# Patient Record
Sex: Female | Born: 1975
Health system: Southern US, Community
[De-identification: ages and names within clinical notes are randomized; demographics above are authoritative.]

## PROBLEM LIST (undated history)

## (undated) ENCOUNTER — Inpatient Hospital Stay (HOSPITAL_COMMUNITY): Payer: Self-pay

## (undated) DIAGNOSIS — R51 Headache: Secondary | ICD-10-CM

## (undated) DIAGNOSIS — Q5128 Other doubling of uterus, other specified: Secondary | ICD-10-CM

## (undated) DIAGNOSIS — O139 Gestational [pregnancy-induced] hypertension without significant proteinuria, unspecified trimester: Secondary | ICD-10-CM

## (undated) DIAGNOSIS — K589 Irritable bowel syndrome without diarrhea: Secondary | ICD-10-CM

## (undated) DIAGNOSIS — O039 Complete or unspecified spontaneous abortion without complication: Secondary | ICD-10-CM

## (undated) DIAGNOSIS — Q512 Other doubling of uterus, unspecified: Secondary | ICD-10-CM

## (undated) HISTORY — DX: Irritable bowel syndrome, unspecified: K58.9

## (undated) HISTORY — PX: DILATION AND CURETTAGE OF UTERUS: SHX78

## (undated) HISTORY — PX: WISDOM TOOTH EXTRACTION: SHX21

## (undated) HISTORY — DX: Complete or unspecified spontaneous abortion without complication: O03.9

## (undated) HISTORY — DX: Headache: R51

---

## 2006-10-30 HISTORY — PX: HEMORRHOID SURGERY: SHX153

## 2010-03-29 ENCOUNTER — Ambulatory Visit (HOSPITAL_COMMUNITY): Admission: RE | Admit: 2010-03-29 | Discharge: 2010-03-29 | Payer: Self-pay | Admitting: Obstetrics and Gynecology

## 2010-04-29 DEATH — deceased

## 2011-01-16 LAB — CBC
HCT: 40.7 % (ref 36.0–46.0)
MCHC: 34.5 g/dL (ref 30.0–36.0)
MCV: 95.5 fL (ref 78.0–100.0)
WBC: 7 10*3/uL (ref 4.0–10.5)

## 2011-01-16 LAB — RH IMMUNE GLOB WKUP(>/=20WKS)(NOT WOMEN'S HOSP): Antibody Screen: NEGATIVE

## 2011-01-16 LAB — ABO/RH: ABO/RH(D): O NEG

## 2011-02-09 LAB — CBC
Hemoglobin: 14.8 g/dL (ref 12.0–16.0)
Platelets: 255 10*3/uL (ref 150–399)

## 2011-02-09 LAB — RUBELLA ANTIBODY, IGM: Rubella: IMMUNE

## 2011-02-09 LAB — ABO/RH: RH Type: NEGATIVE

## 2011-02-09 LAB — HEPATITIS B SURFACE ANTIGEN: Hepatitis B Surface Ag: NEGATIVE

## 2011-02-16 LAB — GC/CHLAMYDIA PROBE AMP, GENITAL
Chlamydia: NEGATIVE
Gonorrhea: NEGATIVE

## 2011-07-28 ENCOUNTER — Inpatient Hospital Stay (HOSPITAL_COMMUNITY)
Admission: AD | Admit: 2011-07-28 | Discharge: 2011-07-30 | DRG: 782 | Disposition: A | Discharge status: Home / Self Care | Payer: 59 | Source: Ambulatory Visit | Attending: Obstetrics and Gynecology | Admitting: Obstetrics and Gynecology

## 2011-07-28 ENCOUNTER — Encounter (HOSPITAL_COMMUNITY): Payer: Self-pay

## 2011-07-28 ENCOUNTER — Other Ambulatory Visit (HOSPITAL_COMMUNITY): Payer: Self-pay | Admitting: Obstetrics and Gynecology

## 2011-07-28 DIAGNOSIS — O2690 Pregnancy related conditions, unspecified, unspecified trimester: Secondary | ICD-10-CM

## 2011-07-28 DIAGNOSIS — D696 Thrombocytopenia, unspecified: Secondary | ICD-10-CM | POA: Diagnosis present

## 2011-07-28 DIAGNOSIS — O139 Gestational [pregnancy-induced] hypertension without significant proteinuria, unspecified trimester: Principal | ICD-10-CM | POA: Diagnosis present

## 2011-07-28 DIAGNOSIS — D689 Coagulation defect, unspecified: Secondary | ICD-10-CM | POA: Diagnosis present

## 2011-07-28 HISTORY — DX: Gestational (pregnancy-induced) hypertension without significant proteinuria, unspecified trimester: O13.9

## 2011-07-28 HISTORY — DX: Other and unspecified doubling of uterus: Q51.28

## 2011-07-28 HISTORY — DX: Other doubling of uterus, unspecified: Q51.20

## 2011-07-28 LAB — URINE MICROSCOPIC-ADD ON

## 2011-07-28 LAB — URINALYSIS, ROUTINE W REFLEX MICROSCOPIC
Nitrite: NEGATIVE
Protein, ur: NEGATIVE mg/dL

## 2011-07-28 LAB — COMPREHENSIVE METABOLIC PANEL
AST: 16 U/L (ref 0–37)
Albumin: 2.8 g/dL — ABNORMAL LOW (ref 3.5–5.2)
CO2: 27 mEq/L (ref 19–32)
GFR calc Af Amer: >60 mL/min (ref >60)
GFR calc non Af Amer: >60 mL/min (ref >60)
Potassium: 3.8 mEq/L (ref 3.5–5.1)
Total Protein: 6.9 g/dL (ref 6.0–8.3)

## 2011-07-28 LAB — LACTATE DEHYDROGENASE: LDH: 178 U/L (ref 94–250)

## 2011-07-28 LAB — CBC
RBC: 4.01 MIL/uL (ref 3.87–5.11)
RDW: 12.9 % (ref 11.5–15.5)
WBC: 12.9 10*3/uL — ABNORMAL HIGH (ref 4.0–10.5)

## 2011-07-28 MED ORDER — ZOLPIDEM TARTRATE 10 MG PO TABS
10.0000 mg | ORAL_TABLET | Freq: Every evening | ORAL | Status: DC | PRN
  Administered 2011-07-29: 10 mg via ORAL
  Filled 2011-07-28: qty 1

## 2011-07-28 MED ORDER — PRENATAL PLUS 27-1 MG PO TABS
1.0000 | ORAL_TABLET | Freq: Every day | ORAL | Status: DC
  Administered 2011-07-28 – 2011-07-29 (×2): 1 via ORAL
  Filled 2011-07-28 (×5): qty 1

## 2011-07-28 MED ORDER — DOCUSATE SODIUM 100 MG PO CAPS
100.0000 mg | ORAL_CAPSULE | Freq: Every day | ORAL | Status: DC
  Administered 2011-07-28 – 2011-07-29 (×2): 100 mg via ORAL
  Filled 2011-07-28 (×5): qty 1

## 2011-07-28 MED ORDER — CALCIUM CARBONATE ANTACID 500 MG PO CHEW
2.0000 | CHEWABLE_TABLET | ORAL | Status: DC | PRN
  Administered 2011-07-29: 400 mg via ORAL
  Filled 2011-07-28 (×2): qty 1
  Filled 2011-07-28: qty 2

## 2011-07-28 MED ORDER — ASPIRIN 81 MG PO CHEW
81.0000 mg | CHEWABLE_TABLET | Freq: Every day | ORAL | Status: DC
  Administered 2011-07-28 – 2011-07-29 (×2): 81 mg via ORAL
  Filled 2011-07-28 (×3): qty 1

## 2011-07-28 MED ORDER — ACETAMINOPHEN 325 MG PO TABS
650.0000 mg | ORAL_TABLET | ORAL | Status: DC | PRN
  Administered 2011-07-28 – 2011-07-30 (×6): 650 mg via ORAL
  Filled 2011-07-28 (×6): qty 2

## 2011-07-28 NOTE — ED Provider Notes (Signed)
History   Pt presents today for preeclamptic workup. She is currently 34.0wks and was noted to have elevated BP in the office. She also reports a HA but denies blurry vision or RUQ pain. She reports GFM. She denies vag dc, bleeding, or any other sx at this time.  Chief Complaint  Patient presents with  . Hypertension   HPI  OB History    Grav Para Term Preterm Abortions TAB SAB Ect Mult Living   7 2 2  0 4 0 4 0 0 2      Past Medical History  Diagnosis Date  . Pregnancy induced hypertension   . Asthma     Past Surgical History  Procedure Date  . Dilation and curettage of uterus     No family history on file.  History  Substance Use Topics  . Smoking status: Never Smoker   . Smokeless tobacco: Not on file  . Alcohol Use: Yes     socially before pregnancy    Allergies:  Allergies  Allergen Reactions  . Sulfa Antibiotics Rash    Prescriptions prior to admission  Medication Sig Dispense Refill  . aspirin 81 MG chewable tablet Chew 81 mg by mouth daily.        . prenatal vitamin w/FE, FA (PRENATAL 1 + 1) 27-1 MG TABS Take 1 tablet by mouth daily.          Review of Systems  Constitutional: Negative for fever.  Eyes: Negative for blurred vision and double vision.  Cardiovascular: Negative for chest pain and palpitations.  Gastrointestinal: Negative for nausea, vomiting, abdominal pain, diarrhea and constipation.  Genitourinary: Negative for dysuria, urgency, frequency and hematuria.  Neurological: Positive for headaches. Negative for dizziness.  Psychiatric/Behavioral: Negative for depression and suicidal ideas.   Physical Exam   Blood pressure 140/85, pulse 83, temperature 98.8 F (37.1 C), temperature source Oral, resp. rate 16, height 5\' 2"  (1.575 m), weight 191 lb 12.8 oz (87 kg), SpO2 99.00%.  Physical Exam  Constitutional: She is oriented to person, place, and time. She appears well-developed and well-nourished. No distress.  HENT:  Head: Normocephalic  and atraumatic.  Eyes: EOM are normal. Pupils are equal, round, and reactive to light.  GI: Soft. She exhibits no distension. There is no tenderness. There is no rebound and no guarding.  Neurological: She is alert and oriented to person, place, and time.  Skin: Skin is warm and dry. She is not diaphoretic.  Psychiatric: She has a normal mood and affect. Her behavior is normal. Judgment and thought content normal.    MAU Course  Procedures  Results for orders placed during the hospital encounter of 07/28/11 (from the past 24 hour(s))  CBC     Status: Abnormal   Collection Time   07/28/11  2:00 PM      Component Value Range   WBC 12.9 (*) 4.0 - 10.5 (K/uL)   RBC 4.01  3.87 - 5.11 (MIL/uL)   Hemoglobin 13.1  12.0 - 15.0 (g/dL)   HCT 95.6  21.3 - 08.6 (%)   MCV 94.8  78.0 - 100.0 (fL)   MCH 32.7  26.0 - 34.0 (pg)   MCHC 34.5  30.0 - 36.0 (g/dL)   RDW 57.8  46.9 - 62.9 (%)   Platelets 136 (*) 150 - 400 (K/uL)  COMPREHENSIVE METABOLIC PANEL     Status: Abnormal   Collection Time   07/28/11  2:00 PM      Component Value Range   Sodium  135  135 - 145 (mEq/L)   Potassium 3.8  3.5 - 5.1 (mEq/L)   Chloride 100  96 - 112 (mEq/L)   CO2 27  19 - 32 (mEq/L)   Glucose, Bld 77  70 - 99 (mg/dL)   BUN 4 (*) 6 - 23 (mg/dL)   Creatinine, Ser 7.82  0.50 - 1.10 (mg/dL)   Calcium 9.9  8.4 - 95.6 (mg/dL)   Total Protein 6.9  6.0 - 8.3 (g/dL)   Albumin 2.8 (*) 3.5 - 5.2 (g/dL)   AST 16  0 - 37 (U/L)   ALT 7  0 - 35 (U/L)   Alkaline Phosphatase 116  39 - 117 (U/L)   Total Bilirubin 0.4  0.3 - 1.2 (mg/dL)   GFR calc non Af Amer >60  >60 (mL/min)   GFR calc Af Amer >60  >60 (mL/min)  LACTATE DEHYDROGENASE     Status: Normal   Collection Time   07/28/11  2:00 PM      Component Value Range   LD 178  94 - 250 (U/L)  URINALYSIS, ROUTINE W REFLEX MICROSCOPIC     Status: Abnormal   Collection Time   07/28/11  2:02 PM      Component Value Range   Color, Urine YELLOW  YELLOW    Appearance CLEAR  CLEAR     Specific Gravity, Urine 1.010  1.005 - 1.030    pH 7.0  5.0 - 8.0    Glucose, UA NEGATIVE  NEGATIVE (mg/dL)   Hgb urine dipstick NEGATIVE  NEGATIVE    Bilirubin Urine NEGATIVE  NEGATIVE    Ketones, ur NEGATIVE  NEGATIVE (mg/dL)   Protein, ur NEGATIVE  NEGATIVE (mg/dL)   Urobilinogen, UA 0.2  0.0 - 1.0 (mg/dL)   Nitrite NEGATIVE  NEGATIVE    Leukocytes, UA MODERATE (*) NEGATIVE   URINE MICROSCOPIC-ADD ON     Status: Abnormal   Collection Time   07/28/11  2:02 PM      Component Value Range   Squamous Epithelial / LPF MANY (*) RARE    WBC, UA 11-20  <3 (WBC/hpf)   RBC / HPF 3-6  <3 (RBC/hpf)   Bacteria, UA MANY (*) RARE     Discussed pt with Dr. Arelia Sneddon. Dr. Arelia Sneddon on his way to evaluate pt. Assessment and Plan  Care of pt turned over to Dr. Arelia Sneddon at 2:59pm.  Clinton Gallant. Hazely Sealey III, DrHSc, MPAS, PA-C  07/28/2011, 2:21 PM   Henrietta Hoover, PA 07/28/11 1459

## 2011-07-28 NOTE — Progress Notes (Signed)
Monitors off per orders 

## 2011-07-28 NOTE — Progress Notes (Signed)
Pt sent from the office today for evaluation of elevated BP. Pt states she has been having a headache, no bleeding or leaking and reports good fetal movement.

## 2011-07-28 NOTE — H&P (Signed)
  Patient is a 35 year old gravida 7 para 2 abortus 4 female at 22 weeks. She is brought in for management of pregnancy-induced hypertension and associated thrombocytopenia. 2 seen in the office today complaining of increased blood pressure. Blood pressure 140/96. Complains of episode of headaches and seeing spots. No right upper quadrant pain. Blood person office 120/94. Gen. reactive nonstress test. Due to elevations of blood pressure at home she was sent to MAU for further evaluation. The pressures in MAU were 130/80 and 140/90. Her PIH laboratories reveal a low platelet count of 136,000.  Other laboratories were normal. Patient will be admitted for observation.  Allergies: Sulfa Exline Medications: Prenatal vitamins  For past medical history family history social history please see prenatal records.  Views systems is noncontributory.  Physical exam: She is afebrile. Blood pressure 130/90. Other vital signs are stable. Respiratory exam: Lungs are clear to auscultation. Cardiovascular system exam: Regular rhythm and rate without murmurs or gallops Abdominal exam: Gravid uterus consistent with dates. Nontender. Cervical exam: Not performed Deep tendon reflex: 2+ without clonus Extremities: 2+ edema Exline  Impression: Intrauterine pregnancy at 34 weeks with pregnancy-induced hypertension. Thrombocytopenia. Plan: Patient be admitted for bedrest. We will repeat her laboratories tomorrow. We'll monitor every shift. Ultrasound be performed tomorrow.

## 2011-07-29 ENCOUNTER — Inpatient Hospital Stay (HOSPITAL_COMMUNITY): Payer: 59

## 2011-07-29 LAB — CBC
MCV: 95.1 fL (ref 78.0–100.0)
Platelets: 137 10*3/uL — ABNORMAL LOW (ref 150–400)
RBC: 3.86 MIL/uL — ABNORMAL LOW (ref 3.87–5.11)
RDW: 13.2 % (ref 11.5–15.5)
WBC: 11.5 10*3/uL — ABNORMAL HIGH (ref 4.0–10.5)

## 2011-07-29 LAB — CREATININE CLEARANCE, URINE, 24 HOUR
Collection Interval-CRCL: 24 hours
Creatinine Clearance: 190 mL/min — ABNORMAL HIGH (ref 75–115)
Creatinine, 24H Ur: 1422 mg/d (ref 700–1800)

## 2011-07-29 LAB — COMPREHENSIVE METABOLIC PANEL
ALT: 9 U/L (ref 0–35)
AST: 31 U/L (ref 0–37)
Albumin: 2.4 g/dL — ABNORMAL LOW (ref 3.5–5.2)
Chloride: 102 mEq/L (ref 96–112)
Creatinine, Ser: 0.52 mg/dL (ref 0.50–1.10)
Potassium: 4.8 mEq/L (ref 3.5–5.1)
Sodium: 133 mEq/L — ABNORMAL LOW (ref 135–145)
Total Bilirubin: 0.4 mg/dL (ref 0.3–1.2)

## 2011-07-29 NOTE — Progress Notes (Signed)
  S:  STILL C/O OF HEADACHE. NO SCOTOMATA O:  127/84 VSS        GRAVID UTERUS NONTENDER         DTR'S 1+        LABS:  PLATLET  137000 LFT NORMAL  24 HR URINE PENDING A:  IUP AT 34+ PIH P:  COMPLETE 24 HR URINE CHECK SONO FOR FETAL WELL BEING

## 2011-07-29 NOTE — Progress Notes (Signed)
Monitors off, NST reactive and reassuring

## 2011-07-30 LAB — COMPREHENSIVE METABOLIC PANEL
ALT: 6 U/L (ref 0–35)
Albumin: 2.4 g/dL — ABNORMAL LOW (ref 3.5–5.2)
Alkaline Phosphatase: 103 U/L (ref 39–117)
Glucose, Bld: 81 mg/dL (ref 70–99)
Potassium: 3.7 mEq/L (ref 3.5–5.1)
Sodium: 139 mEq/L (ref 135–145)
Total Protein: 5.9 g/dL — ABNORMAL LOW (ref 6.0–8.3)

## 2011-07-30 LAB — CBC
Hemoglobin: 11.7 g/dL — ABNORMAL LOW (ref 12.0–15.0)
MCHC: 33.2 g/dL (ref 30.0–36.0)
RDW: 13.2 % (ref 11.5–15.5)
WBC: 10.5 10*3/uL (ref 4.0–10.5)

## 2011-07-30 NOTE — Progress Notes (Signed)
  S:  NO HEADACHES OR SCOTOMATA O: BP 120/70 VSS      DTR 1 +      PLATELET 123000 L  LFT WNL  24 HOUR URINE 167 MG OR PROTIEN IN 24 HRS  CRCL 190      SONO:  BPP 8/8 AFI 21 79%TILE BREECH EFW OK  HC > 97 % TILE A:  IUP AT 34 +  MILD THROMBOCYTOPENIA BUT NO OTHER SIGNS OF PIH P:  D/C HOME PIH PRECAUTIONS F/U IN OFFICE TOMORROW

## 2011-07-30 NOTE — Discharge Summary (Signed)
  Admitting diagnosis: Intrauterine pregnancy at 34 weeks with possible pregnancy-induced hypertension. Thrombocytopenia.  Discharge diagnosis: Intrauterine pregnancy at 34+ weeks with thrombocytopenia. No other evidence of PIH  Complete history and physical please see dictated note  Course in the hospital: The patient was brought in and placed on bedrest. Monitoring of the baby revealed a reactive tracing with no decelerations. Initial platelet count was 136,000. Liver function tests were normal. The following day her platelets were 139,000. Liver function tests remain normal. A 24-hour urine was obtained. Clearance was 190. Patient spilt 167 g of protein in a 24-hour period an ultrasound was obtained. The infant was in the breech presentation. Amniotic fluid index was 21.3 which is the 79th percentile. Biophysical profile was 8 out of 8. Only slight abnormality was the head circumference was greater than the 97th percentile. Intracranial structures were normal. Patient's blood pressure remained very normal per hospitalization. At the time of discharge her blood pressure was 120/70. We had ranges of 102/58 to 127/81. At the time of discharge she was stable. No headaches or scotomata or right upper quadrant pain. Her deep tendon reflexes were 1+.  In terms of complications none were encountered her stay in the hospital.  Patient discharged home in stable condition  Plan: Patient be seen in the office tomorrow for further evaluation. She is to be home on bedrest. Pregnancy-induced hypertension warning signs are discussed. We will repeat her platelet count tomorrow. She'll have a followup ultrasound. Further management will be arranged.

## 2011-07-31 LAB — PROTEIN, URINE, 24 HOUR
Collection Interval-UPROT: 24 hours
Protein, 24H Urine: 167 mg/d — ABNORMAL HIGH (ref 50–100)
Protein, Urine: 4 mg/dL
Urine Total Volume-UPROT: 4175 mL

## 2011-08-14 ENCOUNTER — Telehealth (HOSPITAL_COMMUNITY): Payer: Self-pay | Admitting: *Deleted

## 2011-08-14 ENCOUNTER — Encounter (HOSPITAL_COMMUNITY): Payer: Self-pay | Admitting: *Deleted

## 2011-08-14 NOTE — Telephone Encounter (Signed)
Preadmission screen  

## 2011-08-16 MED ORDER — TERBUTALINE SULFATE 1 MG/ML IJ SOLN
0.2500 mg | Freq: Once | INTRAMUSCULAR | Status: AC
  Administered 2011-08-17: 0.25 mg via SUBCUTANEOUS
  Filled 2011-08-16: qty 1

## 2011-08-16 NOTE — H&P (Signed)
DYNA FIGUEREO  DICTATION # [id number] CSN# [id number]   Meriel Pica, MD 08/16/2011 9:16 AM

## 2011-08-17 ENCOUNTER — Encounter (HOSPITAL_COMMUNITY): Payer: Self-pay

## 2011-08-17 ENCOUNTER — Observation Stay (HOSPITAL_COMMUNITY)
Admission: RE | Admit: 2011-08-17 | Discharge: 2011-08-17 | Disposition: A | Discharge status: Home / Self Care | Payer: 59 | Source: Ambulatory Visit | Attending: Obstetrics and Gynecology | Admitting: Obstetrics and Gynecology

## 2011-08-17 DIAGNOSIS — O321XX0 Maternal care for breech presentation, not applicable or unspecified: Principal | ICD-10-CM | POA: Insufficient documentation

## 2011-08-17 DIAGNOSIS — O139 Gestational [pregnancy-induced] hypertension without significant proteinuria, unspecified trimester: Secondary | ICD-10-CM | POA: Insufficient documentation

## 2011-08-17 MED ORDER — TERBUTALINE SULFATE 1 MG/ML IJ SOLN
INTRAMUSCULAR | Status: AC
  Administered 2011-08-17: 0.25 mg via SUBCUTANEOUS
  Filled 2011-08-17: qty 1

## 2011-08-17 MED ORDER — INFLUENZA VAC TYP A&B SURF ANT IM INJ: 0.5000 mL | INJECTION | Freq: Once | INTRAMUSCULAR | Status: DC

## 2011-08-17 MED ORDER — INFLUENZA VIRUS VACC SPLIT PF IM SUSP
0.5000 mL | Freq: Once | INTRAMUSCULAR | Status: DC
  Filled 2011-08-17: qty 0.5

## 2011-08-17 NOTE — H&P (Signed)
NAME:  [name], [name]                   ACCOUNT NO.:  MEDICAL RECORD NO.:  LOCATION:                                 FACILITY:  PHYSICIAN:  Rondel Episcopo M. Marcelle Overlie, M.D.    DATE OF BIRTH:  DATE OF ADMISSION: DATE OF DISCHARGE:                             HISTORY & PHYSICAL   CHIEF COMPLAINT:  Breech presentation, near term for ECV.  HISTORY OF PRESENT ILLNESS:  This is a 35 year old G-7, P 2-0-4-2, EDD September 08, 2011.  The patient has a persistent breech presentation and presents at this time for ECV.  This procedure including the likelihood of the procedure working the fact that the fetus may revert back to breech or the need for emergent cesarean section for labor, fetal distress, rupture of membranes, all reviewed with her which she understands and accepts.  Recently, her prenatal course has been marked by borderline hypertension.  She has been at bedrest.  She has a history of uterus didelphys and recurrent pregnancy loss.  She has had 2 prior vaginal deliveries in 1999 and 2003, and 3-4 early pregnancy losses.  She is Rh negative.  PAST MEDICAL HISTORY:  Please see the [facility] form for details.  PHYSICAL EXAMINATION:  VITAL SIGNS:  Temperature 98.2, blood pressure 130/84. HEENT:  Unremarkable. NECK:  Supple without masses. LUNGS:  Clear. CARDIOVASCULAR:  Regular rate and rhythm without murmurs, rubs, or gallops. BREASTS:  Not examined. ABDOMEN:  37 cm fundal height.  Breech by Leopold's.  Fetal heart rate 140. CERVIX:  Closed. EXTREMITIES:  1+ edema.  Reflexes 1+.  No clonus.  LABORATORY DATA:  Urine negative for protein.  IMPRESSION: 1. History of uterus didelphys. 2. Gestational hypertension. 3. Persistent breech presentation.  PLAN:  Outpatient ECV.  Discussed the procedure including preprocedure NST, terbutaline, and the technique for ultrasound ECV explained to her along with risks as noted above.     Laquanda Bick M. Marcelle Overlie, M.D.     [facility]MED[facility]  D:   08/16/2011  T:  08/16/2011  Job:  [id number]

## 2011-08-17 NOTE — Progress Notes (Signed)
1015 Pt discharged in stable condition per ambulatory with husband

## 2011-08-17 NOTE — Progress Notes (Signed)
R NST>>0.25mg  SQ terb  Bedside US frank breech with adeq AF.  Unable to accomplish ECV.  Will monitor w/ NST and sched primary CS for 39 weeks

## 2011-08-21 NOTE — H&P (Signed)
[name]  DICTATION # [id number] CSN# [id number]   Meriel Pica, MD 08/21/2011 12:49 PM

## 2011-08-22 NOTE — H&P (Signed)
NAME:  [name] [name]                   ACCOUNT NO.:  MEDICAL RECORD NO.:  LOCATION:                                 FACILITY:  PHYSICIAN:  [name] [name] M. Marcelle Overlie, M.D.    DATE OF BIRTH:  DATE OF ADMISSION: DATE OF DISCHARGE:                             HISTORY & PHYSICAL   CHIEF COMPLAINT:  Breech presentation at term, mild preeclampsia.  HISTORY OF PRESENT ILLNESS:  A 35 year old, G7, P-2-0-4-2, EDD September 08, 2011.  The patient is noted to have a uterus didelphys, has had a persistent breech presentation with this pregnancy.  Attempted ECV was failure to the verge, presents now for primary cesarean section.  This procedure including risks related to bleeding, infection, transfusion, adjacent organ injury, other risks related to phlebitis, wound infection, and expected recovery time all reviewed which she understands and accepts.  On the last several weeks of her pregnancy, she is have borderline hypertension, urine negative for protein.  PIH labs have consistently shown normal platelet count and normal liver function testing.  She has been on increased rest at home.  PAST MEDICAL HISTORY:  She has a history of recurrent pregnancy loss. Did deliver vaginally in 1999 and in 2003, but has also had three pregnancy losses.  Please see her [facility] form for the details of her past medical history.  PHYSICAL EXAMINATION:  VITAL SIGNS:  Temperature 98.2, blood pressure 120/90. HEENT:  Unremarkable. NECK:  Supple without masses. LUNGS:  Clear. CARDIOVASCULAR:  Regular rate and rhythm without murmurs, rubs, or gallops. BREASTS:  Without masses.  Term fundal height.  Breech by Thayer Ohm. Fetal heart rate 146.  Cervix was closed. EXTREMITIES:  1+ edema.  Reflex is 1+.  No clonus.  IMPRESSION: 1. Mild preeclampsia at term. 2. Persistent breech presentation.  PLAN:  Primary cesarean section.  Procedure and risks were reviewed as above.    [name] [name] M. Marcelle Overlie,  M.D.    [facility]/MEDQ  D:  08/21/2011  T:  08/22/2011  Job:  [id number]

## 2011-08-24 ENCOUNTER — Encounter (HOSPITAL_COMMUNITY)
Admission: RE | Admit: 2011-08-24 | Discharge: 2011-08-24 | Disposition: A | Discharge status: Home / Self Care | Payer: 59 | Source: Ambulatory Visit | Attending: Obstetrics and Gynecology | Admitting: Obstetrics and Gynecology

## 2011-08-24 ENCOUNTER — Encounter (HOSPITAL_COMMUNITY): Payer: Self-pay

## 2011-08-24 LAB — CBC
MCV: 93.7 fL (ref 78.0–100.0)
Platelets: 133 10*3/uL — ABNORMAL LOW (ref 150–400)
RDW: 13.2 % (ref 11.5–15.5)
WBC: 9.8 10*3/uL (ref 4.0–10.5)

## 2011-08-24 LAB — SURGICAL PCR SCREEN: Staphylococcus aureus: NEGATIVE

## 2011-08-24 LAB — RPR: RPR Ser Ql: NONREACTIVE

## 2011-08-24 NOTE — Patient Instructions (Signed)
   Your procedure is scheduled on:08/31/11  Enter through the Main Entrance of [facility] at:2:00 pm Pick up the phone at the desk and dial [phone] and inform us of your arrival.  Please call this number if you have any problems the morning of surgery: [phone]  Remember: Do not eat food after midnight:Wed Do not drink clear liquids after:1130 am Take these medicines the morning of surgery with a SIP OF WATER:  Do not wear jewelry, make-up, or FINGER nail polish Do not wear lotions, powders, or perfumes.  You may wear deodorant. Do not shave 48 hours prior to surgery. Do not bring valuables to the hospital.  Leave suitcase in the car. After Surgery it may be brought to your room. For patients being admitted to the hospital, checkout time is 11:00am the day of discharge.  Patients discharged on the day of surgery will not be allowed to drive home.   Name and phone number of your driver: Tammy Sours- [phone]  Remember to use your hibiclens as instructed.Please shower with 1/2 bottle the evening before your surgery and the other 1/2 bottle the morning of surgery.

## 2011-08-31 ENCOUNTER — Encounter (HOSPITAL_COMMUNITY)
Admission: RE | Disposition: A | Discharge status: Home / Self Care | Payer: Self-pay | Source: Ambulatory Visit | Attending: Obstetrics and Gynecology

## 2011-08-31 ENCOUNTER — Encounter (HOSPITAL_COMMUNITY): Payer: Self-pay | Admitting: Anesthesiology

## 2011-08-31 ENCOUNTER — Inpatient Hospital Stay (HOSPITAL_COMMUNITY)
Admission: RE | Admit: 2011-08-31 | Discharge: 2011-09-03 | DRG: 765 | Disposition: A | Discharge status: Home / Self Care | Payer: 59 | Source: Ambulatory Visit | Attending: Obstetrics and Gynecology | Admitting: Obstetrics and Gynecology

## 2011-08-31 ENCOUNTER — Encounter (HOSPITAL_COMMUNITY): Payer: Self-pay | Admitting: *Deleted

## 2011-08-31 ENCOUNTER — Inpatient Hospital Stay (HOSPITAL_COMMUNITY): Payer: 59 | Admitting: Anesthesiology

## 2011-08-31 DIAGNOSIS — K649 Unspecified hemorrhoids: Secondary | ICD-10-CM | POA: Diagnosis present

## 2011-08-31 DIAGNOSIS — O321XX0 Maternal care for breech presentation, not applicable or unspecified: Principal | ICD-10-CM | POA: Diagnosis present

## 2011-08-31 DIAGNOSIS — O878 Other venous complications in the puerperium: Secondary | ICD-10-CM | POA: Diagnosis present

## 2011-08-31 DIAGNOSIS — IMO0002 Reserved for concepts with insufficient information to code with codable children: Secondary | ICD-10-CM | POA: Diagnosis present

## 2011-08-31 SURGERY — Surgical Case
Anesthesia: Regional | Site: Abdomen | Wound class: Clean Contaminated

## 2011-08-31 MED ORDER — BUPIVACAINE IN DEXTROSE 0.75-8.25 % IT SOLN
INTRATHECAL | Status: DC | PRN
  Administered 2011-08-31: 1.6 mL via INTRATHECAL

## 2011-08-31 MED ORDER — DIBUCAINE 1 % RE OINT
1.0000 "application " | TOPICAL_OINTMENT | RECTAL | Status: DC | PRN
  Administered 2011-09-03: 1 via RECTAL
  Filled 2011-08-31: qty 28

## 2011-08-31 MED ORDER — FENTANYL CITRATE 0.05 MG/ML IJ SOLN
INTRAMUSCULAR | Status: AC
  Administered 2011-08-31: 50 ug via INTRAVENOUS
  Filled 2011-08-31: qty 2

## 2011-08-31 MED ORDER — HYDROMORPHONE HCL PF 1 MG/ML IJ SOLN
0.5000 mg | INTRAMUSCULAR | Status: AC | PRN
  Administered 2011-08-31 (×2): 0.5 mg via INTRAVENOUS
  Filled 2011-08-31: qty 1

## 2011-08-31 MED ORDER — MEPERIDINE HCL 25 MG/ML IJ SOLN: 6.2500 mg | INTRAMUSCULAR | Status: DC | PRN

## 2011-08-31 MED ORDER — IBUPROFEN 600 MG PO TABS
600.0000 mg | ORAL_TABLET | Freq: Four times a day (QID) | ORAL | Status: DC | PRN
  Administered 2011-09-02 – 2011-09-03 (×5): 600 mg via ORAL
  Filled 2011-08-31 (×5): qty 1

## 2011-08-31 MED ORDER — OXYTOCIN 10 UNIT/ML IJ SOLN
INTRAMUSCULAR | Status: AC
  Filled 2011-08-31: qty 2

## 2011-08-31 MED ORDER — OXYCODONE-ACETAMINOPHEN 5-325 MG PO TABS: 1.0000 | ORAL_TABLET | Freq: Four times a day (QID) | ORAL | Status: DC | PRN

## 2011-08-31 MED ORDER — NALBUPHINE HCL 10 MG/ML IJ SOLN
5.0000 mg | INTRAMUSCULAR | Status: DC | PRN
Start: 2011-08-31 — End: 2011-09-03
  Filled 2011-08-31: qty 1

## 2011-08-31 MED ORDER — BISACODYL 10 MG RE SUPP: 10.0000 mg | Freq: Every day | RECTAL | Status: DC | PRN

## 2011-08-31 MED ORDER — SIMETHICONE 80 MG PO CHEW
80.0000 mg | CHEWABLE_TABLET | ORAL | Status: DC | PRN
  Administered 2011-09-02: 80 mg via ORAL

## 2011-08-31 MED ORDER — ONDANSETRON HCL 4 MG/2ML IJ SOLN
INTRAMUSCULAR | Status: AC
  Filled 2011-08-31: qty 2

## 2011-08-31 MED ORDER — BUPIVACAINE IN DEXTROSE 0.75-8.25 % IT SOLN
INTRATHECAL | Status: AC
  Filled 2011-08-31: qty 2

## 2011-08-31 MED ORDER — SODIUM CHLORIDE 0.9 % IV SOLN: 1.0000 ug/kg/h | INTRAVENOUS | Status: DC | PRN

## 2011-08-31 MED ORDER — METOCLOPRAMIDE HCL 5 MG/ML IJ SOLN: 10.0000 mg | Freq: Three times a day (TID) | INTRAMUSCULAR | Status: DC | PRN

## 2011-08-31 MED ORDER — SCOPOLAMINE 1 MG/3DAYS TD PT72
1.0000 | MEDICATED_PATCH | Freq: Once | TRANSDERMAL | Status: DC
  Administered 2011-08-31: 1.5 mg via TRANSDERMAL

## 2011-08-31 MED ORDER — DIPHENHYDRAMINE HCL 25 MG PO CAPS: 25.0000 mg | ORAL_CAPSULE | ORAL | Status: DC | PRN

## 2011-08-31 MED ORDER — SENNOSIDES-DOCUSATE SODIUM 8.6-50 MG PO TABS
2.0000 | ORAL_TABLET | Freq: Every day | ORAL | Status: DC
  Administered 2011-09-01 – 2011-09-02 (×2): 2 via ORAL

## 2011-08-31 MED ORDER — LACTATED RINGERS IV SOLN
INTRAVENOUS | Status: DC
  Administered 2011-08-31: 21:00:00 via INTRAVENOUS

## 2011-08-31 MED ORDER — FENTANYL CITRATE 0.05 MG/ML IJ SOLN
25.0000 ug | INTRAMUSCULAR | Status: DC | PRN
  Administered 2011-08-31 (×4): 50 ug via INTRAVENOUS

## 2011-08-31 MED ORDER — IBUPROFEN 800 MG PO TABS: 800.0000 mg | ORAL_TABLET | Freq: Three times a day (TID) | ORAL | Status: DC | PRN

## 2011-08-31 MED ORDER — NALOXONE HCL 0.4 MG/ML IJ SOLN: 0.4000 mg | INTRAMUSCULAR | Status: DC | PRN

## 2011-08-31 MED ORDER — CEFAZOLIN SODIUM 1-5 GM-% IV SOLN
INTRAVENOUS | Status: AC
  Administered 2011-08-31: 1 g via INTRAVENOUS
  Filled 2011-08-31: qty 50

## 2011-08-31 MED ORDER — PHENYLEPHRINE HCL 10 MG/ML IJ SOLN
INTRAMUSCULAR | Status: AC
  Filled 2011-08-31: qty 1

## 2011-08-31 MED ORDER — SODIUM CHLORIDE 0.9 % IJ SOLN: 3.0000 mL | INTRAMUSCULAR | Status: DC | PRN

## 2011-08-31 MED ORDER — LANOLIN HYDROUS EX OINT: 1.0000 "application " | TOPICAL_OINTMENT | CUTANEOUS | Status: DC | PRN

## 2011-08-31 MED ORDER — MENTHOL 3 MG MT LOZG: 1.0000 | LOZENGE | OROMUCOSAL | Status: DC | PRN

## 2011-08-31 MED ORDER — NALBUPHINE HCL 10 MG/ML IJ SOLN
5.0000 mg | INTRAMUSCULAR | Status: DC | PRN
  Administered 2011-08-31: 10 mg via INTRAVENOUS
  Administered 2011-09-01: 5 mg via INTRAVENOUS
  Filled 2011-08-31: qty 1

## 2011-08-31 MED ORDER — MORPHINE SULFATE (PF) 0.5 MG/ML IJ SOLN
INTRAMUSCULAR | Status: DC | PRN
  Administered 2011-08-31: 150 ug via INTRATHECAL

## 2011-08-31 MED ORDER — FENTANYL CITRATE 0.05 MG/ML IJ SOLN
INTRAMUSCULAR | Status: AC
  Filled 2011-08-31: qty 2

## 2011-08-31 MED ORDER — LACTATED RINGERS IV SOLN
INTRAVENOUS | Status: DC
  Administered 2011-08-31 (×3): via INTRAVENOUS

## 2011-08-31 MED ORDER — TETANUS-DIPHTH-ACELL PERTUSSIS 5-2.5-18.5 LF-MCG/0.5 IM SUSP: 0.5000 mL | Freq: Once | INTRAMUSCULAR | Status: DC

## 2011-08-31 MED ORDER — OXYTOCIN 20 UNITS IN LACTATED RINGERS INFUSION - SIMPLE
INTRAVENOUS | Status: DC | PRN
  Administered 2011-08-31: 20 [IU] via INTRAVENOUS

## 2011-08-31 MED ORDER — FENTANYL CITRATE 0.05 MG/ML IJ SOLN
INTRAMUSCULAR | Status: DC | PRN
  Administered 2011-08-31: 75 ug via INTRAVENOUS
  Administered 2011-08-31: 25 ug via INTRATHECAL

## 2011-08-31 MED ORDER — DIPHENHYDRAMINE HCL 50 MG/ML IJ SOLN: 12.5000 mg | INTRAMUSCULAR | Status: DC | PRN

## 2011-08-31 MED ORDER — SIMETHICONE 80 MG PO CHEW
80.0000 mg | CHEWABLE_TABLET | Freq: Three times a day (TID) | ORAL | Status: DC
  Administered 2011-09-01 – 2011-09-03 (×9): 80 mg via ORAL

## 2011-08-31 MED ORDER — ZOLPIDEM TARTRATE 5 MG PO TABS: 5.0000 mg | ORAL_TABLET | Freq: Every evening | ORAL | Status: DC | PRN

## 2011-08-31 MED ORDER — ONDANSETRON HCL 4 MG/2ML IJ SOLN: 4.0000 mg | Freq: Three times a day (TID) | INTRAMUSCULAR | Status: DC | PRN

## 2011-08-31 MED ORDER — PRENATAL PLUS 27-1 MG PO TABS
1.0000 | ORAL_TABLET | Freq: Every day | ORAL | Status: DC
  Administered 2011-09-03: 1 via ORAL
  Filled 2011-08-31 (×2): qty 1

## 2011-08-31 MED ORDER — SCOPOLAMINE 1 MG/3DAYS TD PT72: 1.0000 | MEDICATED_PATCH | Freq: Once | TRANSDERMAL | Status: DC

## 2011-08-31 MED ORDER — MORPHINE SULFATE (PF) 0.5 MG/ML IJ SOLN
INTRAMUSCULAR | Status: DC | PRN
  Administered 2011-08-31 (×2): 2000 ug via EPIDURAL

## 2011-08-31 MED ORDER — SODIUM CHLORIDE 0.9 % IJ SOLN: 3.0000 mL | Freq: Two times a day (BID) | INTRAMUSCULAR | Status: DC

## 2011-08-31 MED ORDER — CEFAZOLIN SODIUM 1-5 GM-% IV SOLN: 1.0000 g | INTRAVENOUS | Status: DC

## 2011-08-31 MED ORDER — DIPHENHYDRAMINE HCL 25 MG PO CAPS: 25.0000 mg | ORAL_CAPSULE | Freq: Four times a day (QID) | ORAL | Status: DC | PRN

## 2011-08-31 MED ORDER — OXYTOCIN 20 UNITS IN LACTATED RINGERS INFUSION - SIMPLE: 125.0000 mL/h | INTRAVENOUS | Status: AC

## 2011-08-31 MED ORDER — SODIUM CHLORIDE 0.9 % IV SOLN: 250.0000 mL | INTRAVENOUS | Status: DC

## 2011-08-31 MED ORDER — LACTATED RINGERS IV SOLN
INTRAVENOUS | Status: DC
  Administered 2011-09-01 (×2): via INTRAVENOUS

## 2011-08-31 MED ORDER — ONDANSETRON HCL 4 MG PO TABS: 4.0000 mg | ORAL_TABLET | ORAL | Status: DC | PRN

## 2011-08-31 MED ORDER — KETOROLAC TROMETHAMINE 30 MG/ML IJ SOLN
30.0000 mg | Freq: Four times a day (QID) | INTRAMUSCULAR | Status: AC | PRN
  Administered 2011-08-31 – 2011-09-01 (×2): 30 mg via INTRAVENOUS
  Filled 2011-08-31 (×2): qty 1

## 2011-08-31 MED ORDER — WITCH HAZEL-GLYCERIN EX PADS: 1.0000 "application " | MEDICATED_PAD | CUTANEOUS | Status: DC | PRN

## 2011-08-31 MED ORDER — PHENYLEPHRINE HCL 10 MG/ML IJ SOLN
INTRAMUSCULAR | Status: DC | PRN
  Administered 2011-08-31 (×2): 80 ug via INTRAVENOUS
  Administered 2011-08-31: 40 ug via INTRAVENOUS

## 2011-08-31 MED ORDER — METOCLOPRAMIDE HCL 5 MG/ML IJ SOLN: 10.0000 mg | Freq: Once | INTRAMUSCULAR | Status: DC | PRN

## 2011-08-31 MED ORDER — FLEET ENEMA 7-19 GM/118ML RE ENEM: 1.0000 | ENEMA | RECTAL | Status: DC | PRN

## 2011-08-31 MED ORDER — MORPHINE SULFATE 0.5 MG/ML IJ SOLN
INTRAMUSCULAR | Status: AC
  Filled 2011-08-31: qty 10

## 2011-08-31 MED ORDER — KETOROLAC TROMETHAMINE 30 MG/ML IJ SOLN: 30.0000 mg | Freq: Four times a day (QID) | INTRAMUSCULAR | Status: AC | PRN

## 2011-08-31 MED ORDER — ONDANSETRON HCL 4 MG/2ML IJ SOLN: 4.0000 mg | INTRAMUSCULAR | Status: DC | PRN

## 2011-08-31 MED ORDER — DIPHENHYDRAMINE HCL 50 MG/ML IJ SOLN: 25.0000 mg | INTRAMUSCULAR | Status: DC | PRN

## 2011-08-31 MED ORDER — MEASLES, MUMPS & RUBELLA VAC ~~LOC~~ INJ
0.5000 mL | INJECTION | Freq: Once | SUBCUTANEOUS | Status: DC
  Filled 2011-08-31: qty 0.5

## 2011-08-31 MED ORDER — SCOPOLAMINE 1 MG/3DAYS TD PT72
MEDICATED_PATCH | TRANSDERMAL | Status: AC
  Administered 2011-08-31: 1.5 mg via TRANSDERMAL
  Filled 2011-08-31: qty 1

## 2011-08-31 SURGICAL SUPPLY — 23 items
CLOTH BEACON ORANGE TIMEOUT ST (SAFETY) ×2 IMPLANT
DRESSING TELFA 8X3 (GAUZE/BANDAGES/DRESSINGS) IMPLANT
DRSG COVADERM 4X10 (GAUZE/BANDAGES/DRESSINGS) ×2 IMPLANT
ELECT REM PT RETURN 9FT ADLT (ELECTROSURGICAL) ×2
ELECTRODE REM PT RTRN 9FT ADLT (ELECTROSURGICAL) ×1 IMPLANT
EXTRACTOR VACUUM M CUP 4 TUBE (SUCTIONS) IMPLANT
GAUZE SPONGE 4X4 12PLY STRL LF (GAUZE/BANDAGES/DRESSINGS) ×4 IMPLANT
GLOVE BIO SURGEON STRL SZ7 (GLOVE) ×4 IMPLANT
GOWN PREVENTION PLUS LG XLONG (DISPOSABLE) ×6 IMPLANT
KIT ABG SYR 3ML LUER SLIP (SYRINGE) IMPLANT
NEEDLE HYPO 25X5/8 SAFETYGLIDE (NEEDLE) ×2 IMPLANT
NS IRRIG 1000ML POUR BTL (IV SOLUTION) ×2 IMPLANT
PACK C SECTION WH (CUSTOM PROCEDURE TRAY) ×2 IMPLANT
PAD ABD 7.5X8 STRL (GAUZE/BANDAGES/DRESSINGS) IMPLANT
SLEEVE SCD COMPRESS KNEE MED (MISCELLANEOUS) IMPLANT
SUT CHROMIC 0 CTX 36 (SUTURE) ×6 IMPLANT
SUT MON AB 4-0 PS1 27 (SUTURE) ×2 IMPLANT
SUT PDS AB 0 CT1 27 (SUTURE) ×4 IMPLANT
SUT VIC AB 3-0 CT1 27 (SUTURE) ×2
SUT VIC AB 3-0 CT1 TAPERPNT 27 (SUTURE) ×2 IMPLANT
TOWEL OR 17X24 6PK STRL BLUE (TOWEL DISPOSABLE) ×4 IMPLANT
TRAY FOLEY CATH 14FR (SET/KITS/TRAYS/PACK) ×2 IMPLANT
WATER STERILE IRR 1000ML POUR (IV SOLUTION) ×2 IMPLANT

## 2011-08-31 NOTE — Anesthesia Postprocedure Evaluation (Signed)
  Anesthesia Post-op Note  Patient: [name]  Procedure(s) Performed:  CESAREAN SECTION - primary  Patient Location: PACU  Anesthesia Type: Spinal  Level of Consciousness: awake, alert  and oriented  Airway and Oxygen Therapy: Patient Spontanous Breathing  Post-op Pain: none  Post-op Assessment: Post-op Vital signs reviewed, Patient's Cardiovascular Status Stable, Respiratory Function Stable, Patent Airway, No signs of Nausea or vomiting, Pain level controlled, No headache and No backache  Post-op Vital Signs: Reviewed and stable  Complications: No apparent anesthesia complications

## 2011-08-31 NOTE — Transfer of Care (Signed)
Immediate Anesthesia Transfer of Care Note  Patient: [name]  Procedure(s) Performed:  CESAREAN SECTION - primary  Patient Location: PACU  Anesthesia Type: Spinal  Level of Consciousness: awake and alert   Airway & Oxygen Therapy: Patient Spontanous Breathing  Post-op Assessment: Report given to PACU RN and Post -op Vital signs reviewed and stable  Post vital signs: Reviewed and stable  Complications: No apparent anesthesia complications

## 2011-08-31 NOTE — Anesthesia Procedure Notes (Signed)
Spinal Block  Patient location during procedure: OR Start time: 08/31/2011 4:28 PM Staffing Anesthesiologist: Elzia Hott A. Performed by: anesthesiologist  Preanesthetic Checklist Completed: patient identified, site marked, surgical consent, pre-op evaluation, timeout performed, IV checked, risks and benefits discussed and monitors and equipment checked Spinal Block Patient position: sitting Prep: site prepped and draped and DuraPrep Patient monitoring: heart rate, cardiac monitor, continuous pulse ox and blood pressure Approach: midline Location: L3-4 Injection technique: single-shot Needle Needle type: Sprotte  Needle gauge: 24 G Needle length: 9 cm Assessment Sensory level: T2 Additional Notes Patient tolerate procedure well. Adequate sensory level.

## 2011-08-31 NOTE — Addendum Note (Signed)
Addendum  created 08/31/11 2236 by Neeya Prigmore L. Soren Lazarz   Modules edited:Orders

## 2011-08-31 NOTE — Progress Notes (Signed)
Examined in pre-op, bedside US is breech/oblique, FHR 156. Pt c/o hemm, exam shows non-thrombosed hemorrhoids.  No change otherwise in H+PE

## 2011-08-31 NOTE — Op Note (Signed)
Preoperative diagnosis: Term IUP, persistent breech presentation, mild preeclampsia  Postoperative diagnosis: Same  Procedure: Primary low transverse cesarean section  Surgeon: Marcelle Overlie  Anesthesia: Spinal  EBL: 700 cc  Complications: None  Procedure and findings:  The patient was taken to the operating room after an adequate level of spinal anesthetic was obtained with the patient in left tilt position the abdomen was prepped and draped in the usual manner for cesarean section Foley catheter was positioned draining clear urine. Transverse incision made 2 finger breaths above the symphysis carried down to the fascia which was incised and extended transversely. Rectus muscle divided in the midline peritoneum entered superiorly without incident and extended in a vertical manner. The vesicouterine serosa was incised and the bladder was bluntly and sharply dissected below. Bladder blade was positioned. Transverse incision made in the lower segment extended with bandage scissors clear fluid was noted. A double footling breech presentation was noted easy full breech extraction without difficulty delivering a female Apgars 9 and 9 cord pH was sent and is pending at dictation. The placenta was then delivered manually intact, uterus exteriorized cavity wiped clean with a laparotomy pack closure obtained with the first layer with chromic in a locked fashion followed by an imbricating layer with chromic. This was hemostatic the bladder flap area was intact and hemostatic. Bilateral tubes and ovaries were normal. 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 Ovaries otherwise unremarkable.  Prior to closure sponge denies precast reported as correct x2. Enclose a running 2-0 Vicryl suture. 2-0 Vicryl interrupted sutures used to reapproximate the rectus muscles in the midline a 0 PDS was then used to close the  fascia from laterally to midline on either side subcutaneous tissue was irrigated noted to be hemostatic 4-0 Monocryl subcuticular suture a sterile dressing applied she did receive Ancef 1 g IV preop and Pitocin IV after the cord clamp mother and baby doing well at that point clear urine noted at the end of the case  Dictated with [employer], not proofread Rosaland Shiffman M. Milana Obey.D.

## 2011-08-31 NOTE — Anesthesia Preprocedure Evaluation (Addendum)
Anesthesia Evaluation  Patient identified by MRN, date of birth, ID band Patient awake  General Assessment Comment  Reviewed: Allergy & Precautions, H&P , NPO status , Patient's Chart, lab work & pertinent test results  History of Anesthesia Complications (+) PONV  Airway Mallampati: II TM Distance: >3 FB Neck ROM: Full    Dental No notable dental hx. (+) Teeth Intact   Pulmonary asthma  clear to auscultation  Pulmonary exam normal       Cardiovascular hypertension, Regular Normal    Neuro/Psych  Headaches, Negative Neurological ROS  Negative Psych ROS   GI/Hepatic Neg liver ROS  Irritable Bowel Syndrome    Endo/Other  Negative Endocrine ROS  Renal/GU negative Renal ROS  Genitourinary negative   Musculoskeletal   Abdominal   Peds  Hematology negative hematology ROS (+)   Anesthesia Other Findings   Reproductive/Obstetrics (+) Pregnancy                          Anesthesia Physical Anesthesia Plan  ASA: II  Anesthesia Plan: Spinal   Post-op Pain Management:    Induction:   Airway Management Planned:   Additional Equipment:   Intra-op Plan:   Post-operative Plan:   Informed Consent: I have reviewed the patients History and Physical, chart, labs and discussed the procedure including the risks, benefits and alternatives for the proposed anesthesia with the patient or authorized representative who has indicated his/her understanding and acceptance.     Plan Discussed with: Anesthesiologist  Anesthesia Plan Comments:         Anesthesia Quick Evaluation

## 2011-08-31 NOTE — Consult Note (Signed)
Neonatology Note:   Attendance at C-section:    I was asked to attend this primary C/S at term due to breech presentation. The mother is a G67P2A4  with uterus didelphys and mild PIH. ROM at delivery, fluid clear. Infant vigorous with good spontaneous cry and tone. Needed only minimal bulb suctioning. Ap 9/9. Lungs clear to ausc in DR. To CN to care of Pediatrician.   Deatra James, MD

## 2011-09-01 LAB — CBC
HCT: 33.2 % — ABNORMAL LOW (ref 36.0–46.0)
MCHC: 33.4 g/dL (ref 30.0–36.0)
RDW: 13.1 % (ref 11.5–15.5)

## 2011-09-01 MED ORDER — OXYCODONE-ACETAMINOPHEN 5-325 MG PO TABS
2.0000 | ORAL_TABLET | ORAL | Status: AC
  Administered 2011-09-01 (×2): 2 via ORAL
  Filled 2011-09-01 (×3): qty 2

## 2011-09-01 MED ORDER — RHO D IMMUNE GLOBULIN 1500 UNIT/2ML IJ SOLN
300.0000 ug | Freq: Once | INTRAMUSCULAR | Status: AC
  Administered 2011-09-01: 300 ug via INTRAMUSCULAR
  Filled 2011-09-01: qty 2

## 2011-09-01 MED ORDER — HYDROMORPHONE HCL PF 1 MG/ML IJ SOLN
0.5000 mg | INTRAMUSCULAR | Status: AC
  Administered 2011-09-01 (×2): 0.5 mg via INTRAVENOUS
  Filled 2011-09-01: qty 1

## 2011-09-01 MED ORDER — OXYCODONE-ACETAMINOPHEN 5-325 MG PO TABS
1.0000 | ORAL_TABLET | ORAL | Status: DC | PRN
  Administered 2011-09-01: 1 via ORAL
  Administered 2011-09-01 – 2011-09-03 (×9): 2 via ORAL
  Filled 2011-09-01 (×9): qty 2

## 2011-09-01 NOTE — Anesthesia Postprocedure Evaluation (Signed)
  Anesthesia Post-op Note  Patient: [name]  Procedure(s) Performed:  CESAREAN SECTION - primary  Patient Location: Mother/Baby  Anesthesia Type: Spinal  Level of Consciousness: awake and alert   Airway and Oxygen Therapy: Patient Spontanous Breathing  Post-op Assessment: Patient's Cardiovascular Status Stable and Respiratory Function Stable  Post-op Vital Signs: Reviewed and stable  Complications: No apparent anesthesia complications

## 2011-09-01 NOTE — Addendum Note (Signed)
Addendum  created 09/01/11 1005 by Edison Pace, CRNA   Modules edited:Notes Section

## 2011-09-01 NOTE — Progress Notes (Signed)
Had incisional pain last pm. Better today but still bothering her some. Passing flatus, No N/V.  BP 112/73  Pulse 62  Temp(Src) 98.3 F (36.8 C) (Oral)  Resp 18  Wt 90.266 kg (199 lb)  SpO2 95%  LMP 11/23/2010  Breastfeeding? Unknown  Lungs CTA Cor RRR Abd soft, mildly distended, +BS, Dressing dry Ext PAS on    Results for orders placed during the hospital encounter of 08/31/11 (from the past 24 hour(s))  CBC     Status: Abnormal   Collection Time   09/01/11  5:10 AM      Component Value Range   WBC 10.5  4.0 - 10.5 (K/uL)   RBC 3.56 (*) 3.87 - 5.11 (MIL/uL)   Hemoglobin 11.1 (*) 12.0 - 15.0 (g/dL)   HCT 14.7 (*) 82.9 - 46.0 (%)   MCV 93.3  78.0 - 100.0 (fL)   MCH 31.2  26.0 - 34.0 (pg)   MCHC 33.4  30.0 - 36.0 (g/dL)   RDW 56.2  13.0 - 86.5 (%)   Platelets 108 (*) 150 - 400 (K/uL)  A/P: Will give percocet on schedule x2, then prn        Hold NSAID for now because of low platelets        CBC in am

## 2011-09-01 NOTE — Addendum Note (Signed)
Addendum  created 09/01/11 0115 by Gualberto Wahlen L. Evalisse Prajapati   Modules edited:Orders

## 2011-09-02 LAB — CBC
MCV: 94.6 fL (ref 78.0–100.0)
Platelets: 129 10*3/uL — ABNORMAL LOW (ref 150–400)
RBC: 3.73 MIL/uL — ABNORMAL LOW (ref 3.87–5.11)
RDW: 13.5 % (ref 11.5–15.5)
WBC: 9.9 10*3/uL (ref 4.0–10.5)

## 2011-09-02 LAB — RH IG WORKUP (INCLUDES ABO/RH)
Antibody Screen: NEGATIVE
Unit division: 0

## 2011-09-02 NOTE — Progress Notes (Signed)
Ambulating, incision sore, + flatus Blood pressure 128/85, pulse 59, temperature 98.4 F (36.9 C), temperature source Oral, resp. rate 20, weight 90.266 kg (199 lb), last menstrual period 11/23/2010, SpO2 96.00%, unknown if currently breastfeeding.  Abd soft, incision OK  A: satisfactory P: probable D/C tomorrow

## 2011-09-03 MED ORDER — IBUPROFEN 600 MG PO TABS: 600.0000 mg | ORAL_TABLET | Freq: Four times a day (QID) | ORAL | Status: AC | PRN

## 2011-09-03 MED ORDER — OXYCODONE-ACETAMINOPHEN 5-325 MG PO TABS: 1.0000 | ORAL_TABLET | ORAL | Status: AC | PRN

## 2011-09-03 NOTE — Progress Notes (Signed)
Ready to go home Voiding, no BM yet  Blood pressure 117/69, pulse 68, temperature 98 F (36.7 C), temperature source Oral, resp. rate 20, weight 90.266 kg (199 lb), last menstrual period 11/23/2010, SpO2 96.00%, unknown if currently breastfeeding.  Lung CTA Abd soft, good BS  Incision healing well  D/C home Instructions given

## 2011-09-03 NOTE — Discharge Summary (Signed)
Obstetric Discharge Summary Reason for Admission: cesarean section Prenatal Procedures: ultrasound Intrapartum Procedures: cesarean: low cervical, transverse Postpartum Procedures: none Complications-Operative and Postpartum: none Hemoglobin  Date Value Range Status  09/02/2011 11.7* 12.0-15.0 (g/dL) Final     HCT  Date Value Range Status  09/02/2011 35.3* 36.0-46.0 (%) Final    Discharge Diagnoses: Term Pregnancy-delivered  Discharge Information: Date: 09/03/2011 Activity: pelvic rest Diet: routine Medications: PNV and Percocet Condition: stable Instructions: refer to practice specific booklet Discharge to: home Follow-up Information    Follow up with [facility]. Make an appointment in 6 weeks.   Contact information:   [address] [place] [address] [phone]          Newborn Data: Live born female  Birth Weight: 7 lb 11.6 oz (3505 g) APGAR: 9, 9  Home with mother.  Nayeli Calvert II,Kerrilyn Azbill E 09/03/2011, 8:42 AM

## 2011-09-04 ENCOUNTER — Encounter (HOSPITAL_COMMUNITY): Payer: Self-pay | Admitting: Obstetrics and Gynecology

## 2011-10-30 ENCOUNTER — Ambulatory Visit (INDEPENDENT_AMBULATORY_CARE_PROVIDER_SITE_OTHER): Payer: 59 | Admitting: General Surgery

## 2013-04-29 DIAGNOSIS — O039 Complete or unspecified spontaneous abortion without complication: Secondary | ICD-10-CM

## 2013-04-29 HISTORY — DX: Complete or unspecified spontaneous abortion without complication: O03.9

## 2013-09-02 ENCOUNTER — Encounter: Payer: Self-pay | Admitting: Diagnostic Neuroimaging

## 2013-09-02 ENCOUNTER — Ambulatory Visit (INDEPENDENT_AMBULATORY_CARE_PROVIDER_SITE_OTHER): Payer: 59 | Admitting: Diagnostic Neuroimaging

## 2013-09-02 VITALS — BP 147/91 | HR 71 | Temp 98.1°F | Ht 63.0 in | Wt 172.0 lb

## 2013-09-02 DIAGNOSIS — G4700 Insomnia, unspecified: Secondary | ICD-10-CM

## 2013-09-02 DIAGNOSIS — F411 Generalized anxiety disorder: Secondary | ICD-10-CM

## 2013-09-02 DIAGNOSIS — F329 Major depressive disorder, single episode, unspecified: Secondary | ICD-10-CM

## 2013-09-02 DIAGNOSIS — G43109 Migraine with aura, not intractable, without status migrainosus: Secondary | ICD-10-CM

## 2013-09-02 MED ORDER — RIZATRIPTAN BENZOATE 10 MG PO TBDP: 10.0000 mg | ORAL_TABLET | ORAL | Status: DC | PRN

## 2013-09-02 MED ORDER — AMITRIPTYLINE HCL 25 MG PO TABS: 25.0000 mg | ORAL_TABLET | Freq: Every day | ORAL | Status: DC

## 2013-09-02 NOTE — Progress Notes (Signed)
[facility]  PATIENT: [name] DOB: 03-05-1976  REFERRING CLINICIAN: Marcelle Overlie HISTORY FROM: patient REASON FOR VISIT: new consult   HISTORICAL  CHIEF COMPLAINT:  Chief Complaint  Patient presents with  . Headache    HISTORY OF PRESENT ILLNESS:   37 year old right-handed female here for evaluation of headaches.  Patient has had headaches since high school, consisting of severe pain, photophobia, nausea, seeing spots. In 2001 she had an episode where she was unable to see from her right eye, saw a neurologist in [place] was diagnosed with migraine headaches. She's tried on Imitrex but she could not tolerate. She did not followup at that time. Since then she is treated her headaches with over-the-counter medications like Tylenol, Excedrin Migraine, ibuprofen with minimal relief. Some years were better than others. Stress seems to aggravate her headaches.  July 2014 patient had miscarriage and ever since that time has had increasing severity of headaches. These headaches are more severe than previous although similar in quality. She has occipital, bitemporal, retro-orbital pain. She has photophobia, phonophobia nausea. Sometimes she has blurred vision. Sometimes she sees spots. She is having daily nagging headaches with superimposed breakthrough migraines almost a daily basis. Headaches can last all day. She has tried narcotic medications with mild relief.  Other contributing factors include severe insomnia, more stress, depression and anxiety.  REVIEW OF SYSTEMS: Full 14 system review of systems performed and notable only for fatigue pain easy bruising feeling cold diarrhea constipation depression anxiety not asleep decreased energy insomnia memory loss and headache.  ALLERGIES: Allergies  Allergen Reactions  . Sulfa Antibiotics Rash    HOME MEDICATIONS: Outpatient Prescriptions Prior to Visit  Medication Sig Dispense Refill  . prenatal vitamin  w/FE, FA (PRENATAL 1 + 1) 27-1 MG TABS Take 1 tablet by mouth daily.         No facility-administered medications prior to visit.    PAST MEDICAL HISTORY: Past Medical History  Diagnosis Date  . Uterus didelphus   . Pregnancy induced hypertension     with both previous pregnancies  . Headache(784.0)     migraines  . IBS (irritable bowel syndrome)   . Asthma     rare inhaler use  . Miscarriage 04/2013    PAST SURGICAL HISTORY: Past Surgical History  Procedure Laterality Date  . Dilation and curettage of uterus    . Hemorrhoid surgery  2008  . Cesarean section  08/31/2011    Procedure: CESAREAN SECTION;  Surgeon: Meriel Pica;  Location: [facility];  Service: Gynecology;  Laterality: N/A;  primary    FAMILY HISTORY: Family History  Problem Relation Age of Onset  . Hypertension Mother   . Thyroid disease Mother   . Cancer Mother     skin  . Hashimoto's thyroiditis Mother   . Cancer Maternal Grandmother     colon  . Cancer Maternal Grandfather     leaukemia  . Hypotension Neg Hx   . Malignant hyperthermia Neg Hx   . Pseudochol deficiency Neg Hx   . Anesthesia problems Cousin     difficulty waking up from anesthesia as child  . Migraines Mother   . Hypertension Mother   . Thyroid disease Maternal Grandmother   . Thyroid disease Cousin   . Thyroid disease Maternal Aunt   . Bone cancer Paternal Grandmother     SOCIAL HISTORY:  History   Social History  . Marital Status: Married    Spouse Name: Constance Holster"    Number  of Children: 3  . Years of Education: College   Occupational History  .  Other    Stay at home mom   Social History Main Topics  . Smoking status: Never Smoker   . Smokeless tobacco: Never Used  . Alcohol Use: Yes     Comment: 1 glass of wine nightly; beer occasionally  . Drug Use: No  . Sexual Activity: Yes    Birth Control/ Protection: Pill   Other Topics Concern  . Not on file   Social History Narrative   Patient lives at home with  family.   Caffeine Use 1 cup daily     PHYSICAL EXAM  Filed Vitals:   09/02/13 0902  BP: 147/91  Pulse: 71  Temp: 98.1 F (36.7 C)  TempSrc: Oral  Height: 5\' 3"  (1.6 m)  Weight: 172 lb (78.019 kg)    Not recorded    Body mass index is 30.48 kg/(m^2).  GENERAL EXAM: Patient is in no distress  CARDIOVASCULAR: Regular rate and rhythm, no murmurs, no carotid bruits  NEUROLOGIC: MENTAL STATUS: awake, alert, language fluent, comprehension intact, naming intact CRANIAL NERVE: no papilledema on fundoscopic exam, pupils equal and reactive to light, visual fields full to confrontation, extraocular muscles intact, no nystagmus, facial sensation and strength symmetric, uvula midline, shoulder shrug symmetric, tongue midline. MOTOR: normal bulk and tone, full strength in the BUE, BLE SENSORY: normal and symmetric to light touch, temperature, vibration COORDINATION: finger-nose-finger, fine finger movements normal REFLEXES: deep tendon reflexes present and symmetric GAIT/STATION: narrow based gait; able to walk on toes, heels and tandem; romberg is negative   DIAGNOSTIC DATA (LABS, IMAGING, TESTING) - I reviewed patient records, labs, notes, testing and imaging myself where available.  Lab Results  Component Value Date   WBC 9.9 09/02/2011   HGB 11.7* 09/02/2011   HCT 35.3* 09/02/2011   MCV 94.6 09/02/2011   PLT 129* 09/02/2011      Component Value Date/Time   NA 139 07/30/2011 0538   K 3.7 07/30/2011 0538   CL 105 07/30/2011 0538   CO2 26 07/30/2011 0538   GLUCOSE 81 07/30/2011 0538   BUN 9 07/30/2011 0538   CREATININE 0.63 07/30/2011 0538   CREATININE 0.52 07/29/2011 1600   CALCIUM 9.3 07/30/2011 0538   PROT 5.9* 07/30/2011 0538   ALBUMIN 2.4* 07/30/2011 0538   AST 14 07/30/2011 0538   ALT 6 07/30/2011 0538   ALKPHOS 103 07/30/2011 0538   BILITOT 0.3 07/30/2011 0538   GFRNONAA >60 07/30/2011 0538   GFRAA >60 07/30/2011 0538   No results found for this basename: CHOL, HDL, LDLCALC,  LDLDIRECT, TRIG, CHOLHDL   No results found for this basename: HGBA1C   No results found for this basename: VITAMINB12   No results found for this basename: TSH      ASSESSMENT AND PLAN  37 y.o. year old female here with long history of migraine headaches now with increased severity and frequency of headaches since July 2014. Most likely represents migraine exacerbation in the setting of insomnia and increased stress. I will check MRI of the brain to rule out secondary causes. Patient requested I check thyroid function for fatigue evaluation.  PLAN: - amitriptyline 25mg  qhs + maxalt 10mg  prn - stress and sleep factors reviewed  Orders Placed This Encounter  Procedures  . MR Brain Wo Contrast  . TSH   Return in about 3 months (around 12/03/2013) for with Heide Guile or Warren Kugelman.    Suanne Marker, MD  09/02/2013, 10:03 AM Certified in Neurology, Neurophysiology and Neuroimaging  [facility] [address], [address] [place], [place] [address] [phone]

## 2013-09-02 NOTE — Patient Instructions (Signed)
Migraine Headache A migraine headache is an intense, throbbing pain on one or both sides of your head. A migraine can last for 30 minutes to several hours. CAUSES  The exact cause of a migraine headache is not always known. However, a migraine may be caused when nerves in the brain become irritated and release chemicals that cause inflammation. This causes pain. SYMPTOMS  Pain on one or both sides of your head.  Pulsating or throbbing pain.  Severe pain that prevents daily activities.  Pain that is aggravated by any physical activity.  Nausea, vomiting, or both.  Dizziness.  Pain with exposure to bright lights, loud noises, or activity.  General sensitivity to bright lights, loud noises, or smells. Before you get a migraine, you may get warning signs that a migraine is coming (aura). An aura may include:  Seeing flashing lights.  Seeing bright spots, halos, or zig-zag lines.  Having tunnel vision or blurred vision.  Having feelings of numbness or tingling.  Having trouble talking.  Having muscle weakness. MIGRAINE TRIGGERS  Alcohol.  Smoking.  Stress.  Menstruation.  Aged cheeses.  Foods or drinks that contain nitrates, glutamate, aspartame, or tyramine.  Lack of sleep.  Chocolate.  Caffeine.  Hunger.  Physical exertion.  Fatigue.  Medicines used to treat chest pain (nitroglycerine), birth control pills, estrogen, and some blood pressure medicines. DIAGNOSIS  A migraine headache is often diagnosed based on:  Symptoms.  Physical examination.  A CT scan or MRI of your head. TREATMENT Medicines may be given for pain and nausea. Medicines can also be given to help prevent recurrent migraines.  HOME CARE INSTRUCTIONS  Only take over-the-counter or prescription medicines for pain or discomfort as directed by your caregiver. The use of long-term narcotics is not recommended.  Lie down in a dark, quiet room when you have a migraine.  Keep a journal  to find out what may trigger your migraine headaches. For example, write down:  What you eat and drink.  How much sleep you get.  Any change to your diet or medicines.  Limit alcohol consumption.  Quit smoking if you smoke.  Get 7 to 9 hours of sleep, or as recommended by your caregiver.  Limit stress.  Keep lights dim if bright lights bother you and make your migraines worse. SEEK IMMEDIATE MEDICAL CARE IF:   Your migraine becomes severe.  You have a fever.  You have a stiff neck.  You have vision loss.  You have muscular weakness or loss of muscle control.  You start losing your balance or have trouble walking.  You feel faint or pass out.  You have severe symptoms that are different from your first symptoms. MAKE SURE YOU:   Understand these instructions.  Will watch your condition.  Will get help right away if you are not doing well or get worse. Document Released: 10/16/2005 Document Revised: 01/08/2012 Document Reviewed: 10/06/2011 Exit[employer] Patient Information 2014 [employer].  

## 2013-09-03 ENCOUNTER — Telehealth: Payer: Self-pay | Admitting: Diagnostic Neuroimaging

## 2013-09-03 LAB — TSH: TSH: 0.988 u[IU]/mL (ref 0.450–4.500)

## 2013-09-12 MED ORDER — RIZATRIPTAN BENZOATE 10 MG PO TABS: 10.0000 mg | ORAL_TABLET | ORAL | Status: DC | PRN

## 2013-09-12 NOTE — Telephone Encounter (Signed)
TC to pt.  She asking about her lab results.  (TSH - normal).  She has no pcp.  Asking if normal previously and is now lower normal ) could this be indication that this is a problem.  Would like to get tier 2 (tablet of Maxalt vs the dissolving tablet that was called in and needs override per insurance to get the total 9 tabs).  Elavil that she is taking is making her irritable, groggy, (10 days now).  Does this go away?

## 2013-09-12 NOTE — Telephone Encounter (Signed)
I called pt and relayed that MRI order sent to [facility] [phone] on 09-10-13 from [place], [place] order for maxalt tabs placed and sent to pharmacy, if needs PA they can send form to [place], Elavil can be stopped, usually try for one month and then if not benefit then to stop.  No other option given at this time.  She will have more lab work from her ob-gyn for her thyroid.  She will call back as needed.

## 2013-09-12 NOTE — Telephone Encounter (Signed)
rizatriptan tabs rx'd. Ok to stop amitriptyline. -VRP

## 2013-12-03 ENCOUNTER — Ambulatory Visit: Payer: 59 | Admitting: Nurse Practitioner

## 2014-07-30 ENCOUNTER — Encounter (HOSPITAL_COMMUNITY): Payer: Self-pay | Admitting: Emergency Medicine

## 2014-07-30 ENCOUNTER — Emergency Department (HOSPITAL_COMMUNITY): Payer: BC Managed Care – PPO

## 2014-07-30 ENCOUNTER — Emergency Department (HOSPITAL_COMMUNITY)
Admission: EM | Admit: 2014-07-30 | Discharge: 2014-07-31 | Disposition: A | Discharge status: Home / Self Care | Payer: BC Managed Care – PPO | Attending: Emergency Medicine | Admitting: Emergency Medicine

## 2014-07-30 DIAGNOSIS — R079 Chest pain, unspecified: Secondary | ICD-10-CM | POA: Diagnosis present

## 2014-07-30 DIAGNOSIS — R0789 Other chest pain: Secondary | ICD-10-CM | POA: Insufficient documentation

## 2014-07-30 DIAGNOSIS — J45909 Unspecified asthma, uncomplicated: Secondary | ICD-10-CM | POA: Insufficient documentation

## 2014-07-30 DIAGNOSIS — Z79899 Other long term (current) drug therapy: Secondary | ICD-10-CM | POA: Insufficient documentation

## 2014-07-30 DIAGNOSIS — K2100 Gastro-esophageal reflux disease with esophagitis, without bleeding: Secondary | ICD-10-CM

## 2014-07-30 LAB — CBC
HEMATOCRIT: 40.3 % (ref 36.0–46.0)
Hemoglobin: 13.5 g/dL (ref 12.0–15.0)
MCH: 32.9 pg (ref 26.0–34.0)
MCHC: 33.5 g/dL (ref 30.0–36.0)
MCV: 98.3 fL (ref 78.0–100.0)
PLATELETS: 261 10*3/uL (ref 150–400)
RBC: 4.1 MIL/uL (ref 3.87–5.11)
RDW: 12.4 % (ref 11.5–15.5)
WBC: 9.2 10*3/uL (ref 4.0–10.5)

## 2014-07-30 LAB — HEPATIC FUNCTION PANEL
ALBUMIN: 3.8 g/dL (ref 3.5–5.2)
ALK PHOS: 75 U/L (ref 39–117)
ALT: 44 U/L — AB (ref 0–35)
AST: 116 U/L — ABNORMAL HIGH (ref 0–37)
Bilirubin, Direct: <0.2 mg/dL (ref 0.0–0.3)
TOTAL PROTEIN: 7.6 g/dL (ref 6.0–8.3)
Total Bilirubin: 0.3 mg/dL (ref 0.3–1.2)

## 2014-07-30 LAB — I-STAT TROPONIN, ED: Troponin i, poc: 0 ng/mL (ref 0.00–0.08)

## 2014-07-30 LAB — BASIC METABOLIC PANEL
Anion gap: 14 (ref 5–15)
BUN: 14 mg/dL (ref 6–23)
CALCIUM: 9.1 mg/dL (ref 8.4–10.5)
CO2: 26 mEq/L (ref 19–32)
CREATININE: 0.77 mg/dL (ref 0.50–1.10)
Chloride: 100 mEq/L (ref 96–112)
GFR calc Af Amer: >90 mL/min (ref >90)
GFR calc non Af Amer: >90 mL/min (ref >90)
GLUCOSE: 87 mg/dL (ref 70–99)
Potassium: 4.3 mEq/L (ref 3.7–5.3)
SODIUM: 140 meq/L (ref 137–147)

## 2014-07-30 LAB — LIPASE, BLOOD: Lipase: 41 U/L (ref 11–59)

## 2014-07-30 MED ORDER — ASPIRIN 325 MG PO TABS
325.0000 mg | ORAL_TABLET | Freq: Once | ORAL | Status: AC
  Administered 2014-07-30: 325 mg via ORAL
  Filled 2014-07-30: qty 1

## 2014-07-30 MED ORDER — FAMOTIDINE IN NACL 20-0.9 MG/50ML-% IV SOLN
20.0000 mg | Freq: Once | INTRAVENOUS | Status: AC
  Administered 2014-07-30: 20 mg via INTRAVENOUS
  Filled 2014-07-30: qty 50

## 2014-07-30 MED ORDER — PANTOPRAZOLE SODIUM 40 MG IV SOLR
40.0000 mg | Freq: Once | INTRAVENOUS | Status: AC
  Administered 2014-07-30: 40 mg via INTRAVENOUS
  Filled 2014-07-30: qty 40

## 2014-07-30 MED ORDER — CALCIUM CARBONATE ANTACID 500 MG PO CHEW
500.0000 mg | CHEWABLE_TABLET | Freq: Once | ORAL | Status: AC
  Administered 2014-07-31: 500 mg via ORAL
  Filled 2014-07-30: qty 1

## 2014-07-30 MED ORDER — GI COCKTAIL ~~LOC~~
30.0000 mL | Freq: Once | ORAL | Status: AC
  Administered 2014-07-30: 30 mL via ORAL
  Filled 2014-07-30: qty 30

## 2014-07-30 NOTE — ED Provider Notes (Signed)
CSN: [id number]     Arrival date & time 07/30/14  2155 History   First MD Initiated Contact with Patient 07/30/14 2211     Chief Complaint  Patient presents with  . Chest Pain     (Consider location/radiation/quality/duration/timing/severity/associated sxs/prior Treatment) HPI Pt is a 38yo female wit hhx of IBS and previous hx of gastric ulcer presenting to ED with c/o centralized abdominal pain radiating into chest causing substernal chest pain that is aching and throbbing in nature, waxing and waning, 8/10 at worst.  Constant since onset around 1.5 hrs ago after she ate dinner which consisted pork chops. Pt believes grease from dinner may have caused her symptoms.  Reports hx of similar symptoms a year or so ago and took medication at that time for acid reflux but is not currently on daily medication as she "doesnt see her PMD often"  Pt states she has f/u appointment tomorrow for blood work and her physical scheduled for next week.  Denies personal or family hx of CAD. Denies taking medication PTA. Denies fever, chills, cough, SOB, vomiting or diarrhea.    Past Medical History  Diagnosis Date  . Uterus didelphus   . Pregnancy induced hypertension     with both previous pregnancies  . Headache(784.0)     migraines  . IBS (irritable bowel syndrome)   . Asthma     rare inhaler use  . Miscarriage 04/2013   Past Surgical History  Procedure Laterality Date  . Dilation and curettage of uterus    . Hemorrhoid surgery  2008  . Cesarean section  08/31/2011    Procedure: CESAREAN SECTION;  Surgeon: Margarette Asal;  Location: [facility];  Service: Gynecology;  Laterality: N/A;  primary   Family History  Problem Relation Age of Onset  . Hypertension Mother   . Thyroid disease Mother   . Cancer Mother     skin  . Hashimoto's thyroiditis Mother   . Cancer Maternal Grandmother     colon  . Cancer Maternal Grandfather     leaukemia  . Hypotension Neg Hx   . Malignant hyperthermia Neg Hx    . Pseudochol deficiency Neg Hx   . Anesthesia problems Cousin     difficulty waking up from anesthesia as child  . Migraines Mother   . Hypertension Mother   . Thyroid disease Maternal Grandmother   . Thyroid disease Cousin   . Thyroid disease Maternal Aunt   . Bone cancer Paternal Grandmother    History  Substance Use Topics  . Smoking status: Never Smoker   . Smokeless tobacco: Never Used  . Alcohol Use: Yes     Comment: 1 glass of wine nightly; beer occasionally   OB History   Grav Para Term Preterm Abortions TAB SAB Ect Mult Living   7 3 3  0 4 0 4 0 0 3     Review of Systems  Constitutional: Negative for fever, chills, diaphoresis and fatigue.  Respiratory: Negative for cough, chest tightness, shortness of breath and wheezing.   Cardiovascular: Positive for chest pain. Negative for palpitations and leg swelling.  Gastrointestinal: Positive for nausea ( when pain at worst) and abdominal pain (epigastric). Negative for vomiting.  Musculoskeletal: Negative for back pain and myalgias.      Allergies  Sulfa antibiotics  Home Medications   Prior to Admission medications   Medication Sig Start Date End Date Taking? Authorizing Provider  sertraline (ZOLOFT) 50 MG tablet Take 100 mg by mouth every morning.  Yes Historical Provider, MD  famotidine (PEPCID) 20 MG tablet Take 1 tablet (20 mg total) by mouth 2 (two) times daily. 07/31/14   Noland Fordyce, PA-C   BP 123/71  Pulse 68  Temp(Src) 98.3 F (36.8 C) (Oral)  Resp 20  SpO2 98%  LMP 07/24/2014 Physical Exam  Nursing note and vitals reviewed. Constitutional: She appears well-developed and well-nourished. No distress.  Pt lying comfortably in exam bed, NAD.   HENT:  Head: Normocephalic and atraumatic.  Eyes: Conjunctivae are normal. No scleral icterus.  Neck: Normal range of motion.  Cardiovascular: Normal rate, regular rhythm and normal heart sounds.   Pulmonary/Chest: Effort normal and breath sounds normal. No  respiratory distress. She has no wheezes. She has no rales. She exhibits no tenderness.  Abdominal: Soft. Bowel sounds are normal. She exhibits no distension and no mass. There is tenderness. There is no rebound and no guarding.  Soft, non-distended, tenderness in epigastrium. No rebound or guarding. No tenderness in RUQ  Musculoskeletal: Normal range of motion.  Neurological: She is alert.  Skin: Skin is warm and dry. She is not diaphoretic.    ED Course  Procedures (including critical care time) Labs Review Labs Reviewed  HEPATIC FUNCTION PANEL - Abnormal; Notable for the following:    AST 116 (*)    ALT 44 (*)    All other components within normal limits  CBC  BASIC METABOLIC PANEL  LIPASE, BLOOD  I-STAT TROPOININ, ED    Imaging Review Dg Chest Port 1 View  07/30/2014   CLINICAL DATA:  Mid chest pain and shortness of breath.  EXAM: PORTABLE CHEST - 1 VIEW  COMPARISON:  None.  FINDINGS: The heart size and mediastinal contours are within normal limits. Both lungs are clear. The visualized skeletal structures are unremarkable.  IMPRESSION: No active disease.   Electronically Signed   By: Lucienne Capers M.D.   On: 07/30/2014 22:55     EKG Interpretation   Date/Time:  Thursday July 30 2014 22:02:57 EDT Ventricular Rate:  80 PR Interval:  159 QRS Duration: 103 QT Interval:  409 QTC Calculation: 472 R Axis:   89 Text Interpretation:  Sinus rhythm Baseline wander in lead(s) V4 Confirmed  by Zenia Resides  MD, ANTHONY ([id number]) on 07/30/2014 10:47:48 PM      MDM   Final diagnoses:  Gastroesophageal reflux disease with esophagitis  Other chest pain    Pt is 38yo female presenting to ED with epigastric pain and substernal chest pain, hx most suggestive of gastritis or GERD rather than ACS or PE.  Pt is tender in epigastrium, no tenderness in RUQ.  Slight concern for cholecystitis. No fever or chills. No vomiting. Nauseated.  EKG: unremarkable. Labs: mildly elevated AST, possibly  due to slight hemolysis.  Pt does have f/u for routine blood work tomorrow and physical with her PCP for next week. Advised pt to ensure she has her LFTs rechecked.  Pt states pain did improve from 8/10 to 3/10 while in ED after GI cocktail, pepcid, and protonix.   Return precautions provided. Pt verbalized understanding and agreement with tx plan.  Discussed pt with Dr. Zenia Resides who also examined pt. Agrees with tx plan.      Noland Fordyce, PA-C 07/31/14 [phone]

## 2014-07-30 NOTE — ED Notes (Signed)
Pt reports epigastric pain and cp which started about 1.5 hour ago after eating dinner.  Pt states it could be gastric ulcer.  Pt reports eating pork chop for dinner tonight, thinks it could be from the grease.  Pt reports pain is non-radiating, describes pain as constant throbbing.

## 2014-07-30 NOTE — ED Provider Notes (Signed)
Medical screening examination/treatment/procedure(s) were conducted as a shared visit with non-physician practitioner(s) and myself.  I personally evaluated the patient during the encounter.   EKG Interpretation   Date/Time:  Thursday July 30 2014 22:02:57 EDT Ventricular Rate:  80 PR Interval:  159 QRS Duration: 103 QT Interval:  409 QTC Calculation: 472 R Axis:   89 Text Interpretation:  Sinus rhythm Baseline wander in lead(s) V4 Confirmed  by Reynol Arnone  MD, Loriann Bosserman ([id number]) on 07/30/2014 10:47:48 PM     Patient here complaining of epigastric pain that was worse this evening after she ate a pork chop and drink beer. No concern for ACS at this time. Suspect esophagitis versus GERD versus gallbladder disease. Workup pending at this time. Will treat patient symptomatically  Leota Jacobsen, MD 07/30/14 2248

## 2014-07-31 MED ORDER — FAMOTIDINE 20 MG PO TABS: 20.0000 mg | ORAL_TABLET | Freq: Two times a day (BID) | ORAL | Status: DC

## 2014-07-31 NOTE — Discharge Instructions (Signed)

## 2014-08-03 NOTE — ED Provider Notes (Signed)
Medical screening examination/treatment/procedure(s) were conducted as a shared visit with non-physician practitioner(s) and myself.  I personally evaluated the patient during the encounter.   EKG Interpretation   Date/Time:  Thursday July 30 2014 22:02:57 EDT Ventricular Rate:  80 PR Interval:  159 QRS Duration: 103 QT Interval:  409 QTC Calculation: 472 R Axis:   89 Text Interpretation:  Sinus rhythm Baseline wander in lead(s) V4 Confirmed  by Zenia Resides  MD, Jianna Drabik ([id number]) on 07/30/2014 10:47:48 PM       Leota Jacobsen, MD 08/03/14 1030

## 2014-08-31 ENCOUNTER — Encounter (HOSPITAL_COMMUNITY): Payer: Self-pay | Admitting: Emergency Medicine

## 2014-09-04 ENCOUNTER — Other Ambulatory Visit: Payer: Self-pay | Admitting: Gastroenterology

## 2014-09-04 DIAGNOSIS — R1011 Right upper quadrant pain: Secondary | ICD-10-CM

## 2014-09-10 ENCOUNTER — Ambulatory Visit
Admission: RE | Admit: 2014-09-10 | Discharge: 2014-09-10 | Disposition: A | Discharge status: Home / Self Care | Payer: BC Managed Care – PPO | Source: Ambulatory Visit | Attending: Gastroenterology | Admitting: Gastroenterology

## 2014-09-10 DIAGNOSIS — R1011 Right upper quadrant pain: Secondary | ICD-10-CM

## 2015-03-27 ENCOUNTER — Inpatient Hospital Stay (HOSPITAL_COMMUNITY)
Admission: AD | Admit: 2015-03-27 | Discharge: 2015-03-27 | Disposition: A | Discharge status: Home / Self Care | Payer: BLUE CROSS/BLUE SHIELD | Source: Ambulatory Visit | Attending: Obstetrics and Gynecology | Admitting: Obstetrics and Gynecology

## 2015-03-27 ENCOUNTER — Encounter (HOSPITAL_COMMUNITY): Payer: Self-pay | Admitting: *Deleted

## 2015-03-27 DIAGNOSIS — O021 Missed abortion: Secondary | ICD-10-CM | POA: Insufficient documentation

## 2015-03-27 LAB — URINALYSIS, ROUTINE W REFLEX MICROSCOPIC
BILIRUBIN URINE: NEGATIVE
GLUCOSE, UA: NEGATIVE mg/dL
Ketones, ur: NEGATIVE mg/dL
Nitrite: NEGATIVE
Protein, ur: NEGATIVE mg/dL
SPECIFIC GRAVITY, URINE: 1.025 (ref 1.005–1.030)
UROBILINOGEN UA: 0.2 mg/dL (ref 0.0–1.0)
pH: 6.5 (ref 5.0–8.0)

## 2015-03-27 LAB — URINE MICROSCOPIC-ADD ON

## 2015-03-27 LAB — POCT PREGNANCY, URINE: PREG TEST UR: POSITIVE — AB

## 2015-03-27 MED ORDER — MISOPROSTOL 200 MCG PO TABS
800.0000 ug | ORAL_TABLET | Freq: Once | ORAL | Status: AC
  Administered 2015-03-27: 800 ug via VAGINAL
  Filled 2015-03-27: qty 4

## 2015-03-27 MED ORDER — HYDROCODONE-IBUPROFEN 7.5-200 MG PO TABS: 1.0000 | ORAL_TABLET | Freq: Three times a day (TID) | ORAL | Status: DC | PRN

## 2015-03-27 NOTE — Discharge Instructions (Signed)
Keep your scheduled appointment for follow-up. Call Dr. Matthew Saras with further concerns or return to MAU as necessary.

## 2015-03-27 NOTE — Progress Notes (Signed)
Received Cytotec yest for missed AB, had some cramping but did not pass any tissue, min bleeding>>placed 834mcg Cytotec in vag, has F/U arranged at office Tuesday am, Rx Vicoprofen

## 2015-03-27 NOTE — MAU Provider Note (Signed)
Ms. [name] is a 39 y.o. [id number] at [redacted]w[redacted]d with missed AB diagnosed in the office. The patient was given Cytotec yesterday at 1300 and had minimal cramping and bleeding. She discussed her lack of results with Dr. 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   Discussed with Dr. Matthew Saras. He will come to MAU for Cytotec placement 800 mcg Cytotec ordered Consent form signed  Medical screening exam complete  Luvenia Redden, PA-C 03/27/2015 8:50 AM

## 2015-03-27 NOTE — MAU Note (Addendum)
States was dx with failed pregnancy @ 8 wks; no heartbeat. Cytotec placed in vagina at office around 1300 yesterday. States has had some mild cramping and small amount of bleeding, but has not passed pregnancy. States has had multiple failed pregnancies and has used cytotec before and it has always worked. Was told by Dr. Matthew Saras to come to MAU this AM for more cytotec.

## 2015-03-30 ENCOUNTER — Encounter (HOSPITAL_COMMUNITY): Payer: Self-pay | Admitting: *Deleted

## 2015-03-30 NOTE — H&P (Signed)
[name]  DICTATION # [id number] CSN# [id number]   Margarette Asal, MD 03/30/2015 1:24 PM

## 2015-03-31 ENCOUNTER — Encounter (HOSPITAL_COMMUNITY): Payer: Self-pay | Admitting: Anesthesiology

## 2015-03-31 ENCOUNTER — Ambulatory Visit (HOSPITAL_COMMUNITY): Payer: BLUE CROSS/BLUE SHIELD

## 2015-03-31 ENCOUNTER — Encounter (HOSPITAL_COMMUNITY)
Admission: RE | Disposition: A | Discharge status: Home / Self Care | Payer: Self-pay | Source: Ambulatory Visit | Attending: Obstetrics and Gynecology

## 2015-03-31 ENCOUNTER — Ambulatory Visit (HOSPITAL_COMMUNITY)
Admission: RE | Admit: 2015-03-31 | Discharge: 2015-03-31 | Disposition: A | Discharge status: Home / Self Care | Payer: BLUE CROSS/BLUE SHIELD | Source: Ambulatory Visit | Attending: Obstetrics and Gynecology | Admitting: Obstetrics and Gynecology

## 2015-03-31 ENCOUNTER — Ambulatory Visit (HOSPITAL_COMMUNITY): Payer: BLUE CROSS/BLUE SHIELD | Admitting: Anesthesiology

## 2015-03-31 DIAGNOSIS — I10 Essential (primary) hypertension: Secondary | ICD-10-CM | POA: Diagnosis not present

## 2015-03-31 DIAGNOSIS — O021 Missed abortion: Secondary | ICD-10-CM | POA: Insufficient documentation

## 2015-03-31 DIAGNOSIS — O029 Abnormal product of conception, unspecified: Secondary | ICD-10-CM

## 2015-03-31 HISTORY — PX: DILATION AND EVACUATION: SHX1459

## 2015-03-31 LAB — CBC
HCT: 39.9 % (ref 36.0–46.0)
Hemoglobin: 13.6 g/dL (ref 12.0–15.0)
MCH: 32.2 pg (ref 26.0–34.0)
MCHC: 34.1 g/dL (ref 30.0–36.0)
MCV: 94.5 fL (ref 78.0–100.0)
Platelets: 246 10*3/uL (ref 150–400)
RBC: 4.22 MIL/uL (ref 3.87–5.11)
RDW: 13.1 % (ref 11.5–15.5)
WBC: 6.9 10*3/uL (ref 4.0–10.5)

## 2015-03-31 SURGERY — DILATION AND EVACUATION, UTERUS
Anesthesia: Monitor Anesthesia Care | Site: Vagina

## 2015-03-31 MED ORDER — SILVER NITRATE-POT NITRATE 75-25 % EX MISC
CUTANEOUS | Status: DC | PRN
  Administered 2015-03-31: 1

## 2015-03-31 MED ORDER — PROPOFOL 500 MG/50ML IV EMUL
INTRAVENOUS | Status: DC | PRN
  Administered 2015-03-31 (×2): 40 mg via INTRAVENOUS

## 2015-03-31 MED ORDER — FENTANYL CITRATE (PF) 100 MCG/2ML IJ SOLN
INTRAMUSCULAR | Status: AC
  Filled 2015-03-31: qty 2

## 2015-03-31 MED ORDER — CEFOTETAN DISODIUM 2 G IJ SOLR
2.0000 g | INTRAMUSCULAR | Status: AC
  Administered 2015-03-31: 2 g via INTRAVENOUS
  Filled 2015-03-31: qty 2

## 2015-03-31 MED ORDER — LIDOCAINE HCL (CARDIAC) 20 MG/ML IV SOLN
INTRAVENOUS | Status: AC
  Filled 2015-03-31: qty 5

## 2015-03-31 MED ORDER — LIDOCAINE HCL 1 % IJ SOLN
INTRAMUSCULAR | Status: DC | PRN
  Administered 2015-03-31: 15 mL

## 2015-03-31 MED ORDER — ACETAMINOPHEN 325 MG PO TABS
ORAL_TABLET | ORAL | Status: AC
  Filled 2015-03-31: qty 2

## 2015-03-31 MED ORDER — HYDROMORPHONE HCL 1 MG/ML IJ SOLN
INTRAMUSCULAR | Status: AC
  Filled 2015-03-31: qty 1

## 2015-03-31 MED ORDER — LIDOCAINE HCL 1 % IJ SOLN
INTRAMUSCULAR | Status: AC
  Filled 2015-03-31: qty 20

## 2015-03-31 MED ORDER — LACTATED RINGERS IV SOLN
INTRAVENOUS | Status: DC
  Administered 2015-03-31: 12:00:00 via INTRAVENOUS

## 2015-03-31 MED ORDER — LIDOCAINE HCL (CARDIAC) 20 MG/ML IV SOLN
INTRAVENOUS | Status: DC | PRN
  Administered 2015-03-31: 80 mg via INTRAVENOUS

## 2015-03-31 MED ORDER — PROPOFOL 10 MG/ML IV BOLUS
INTRAVENOUS | Status: AC
  Filled 2015-03-31: qty 20

## 2015-03-31 MED ORDER — HYDROCODONE-ACETAMINOPHEN 5-325 MG PO TABS
1.0000 | ORAL_TABLET | Freq: Once | ORAL | Status: AC
  Administered 2015-03-31: 1 via ORAL

## 2015-03-31 MED ORDER — ONDANSETRON HCL 4 MG/2ML IJ SOLN
INTRAMUSCULAR | Status: DC | PRN
Start: 2015-03-31 — End: 2015-03-31
  Administered 2015-03-31: 4 mg via INTRAVENOUS

## 2015-03-31 MED ORDER — ONDANSETRON HCL 4 MG/2ML IJ SOLN: 4.0000 mg | Freq: Once | INTRAMUSCULAR | Status: DC | PRN

## 2015-03-31 MED ORDER — STERILE WATER FOR IRRIGATION IR SOLN
Status: DC | PRN
  Administered 2015-03-31: 1000 mL

## 2015-03-31 MED ORDER — PROPOFOL INFUSION 10 MG/ML OPTIME
INTRAVENOUS | Status: DC | PRN
  Administered 2015-03-31: 80 ug/kg/min via INTRAVENOUS

## 2015-03-31 MED ORDER — HYDROCODONE-ACETAMINOPHEN 5-325 MG PO TABS
ORAL_TABLET | ORAL | Status: AC
  Filled 2015-03-31: qty 1

## 2015-03-31 MED ORDER — MIDAZOLAM HCL 2 MG/2ML IJ SOLN
INTRAMUSCULAR | Status: DC | PRN
  Administered 2015-03-31 (×2): 1 mg via INTRAVENOUS

## 2015-03-31 MED ORDER — PROPOFOL 10 MG/ML IV BOLUS
INTRAVENOUS | Status: AC
  Filled 2015-03-31: qty 40

## 2015-03-31 MED ORDER — HYDROCODONE-IBUPROFEN 7.5-200 MG PO TABS: 1.0000 | ORAL_TABLET | Freq: Three times a day (TID) | ORAL | Status: DC | PRN

## 2015-03-31 MED ORDER — ONDANSETRON HCL 4 MG/2ML IJ SOLN
INTRAMUSCULAR | Status: AC
  Filled 2015-03-31: qty 2

## 2015-03-31 MED ORDER — ACETAMINOPHEN 325 MG PO TABS
650.0000 mg | ORAL_TABLET | Freq: Once | ORAL | Status: AC
  Administered 2015-03-31: 650 mg via ORAL

## 2015-03-31 MED ORDER — GLYCOPYRROLATE 0.2 MG/ML IJ SOLN
INTRAMUSCULAR | Status: AC
  Filled 2015-03-31: qty 1

## 2015-03-31 MED ORDER — HYDROMORPHONE HCL 1 MG/ML IJ SOLN
INTRAMUSCULAR | Status: DC | PRN
  Administered 2015-03-31: .2 mg via INTRAVENOUS

## 2015-03-31 MED ORDER — MIDAZOLAM HCL 2 MG/2ML IJ SOLN
INTRAMUSCULAR | Status: AC
  Filled 2015-03-31: qty 2

## 2015-03-31 MED ORDER — KETOROLAC TROMETHAMINE 30 MG/ML IJ SOLN
INTRAMUSCULAR | Status: AC
  Filled 2015-03-31: qty 1

## 2015-03-31 MED ORDER — KETOROLAC TROMETHAMINE 30 MG/ML IJ SOLN
INTRAMUSCULAR | Status: DC | PRN
  Administered 2015-03-31: 30 mg via INTRAVENOUS

## 2015-03-31 MED ORDER — SCOPOLAMINE 1 MG/3DAYS TD PT72
MEDICATED_PATCH | TRANSDERMAL | Status: AC
  Administered 2015-03-31: 1.5 mg via TRANSDERMAL
  Filled 2015-03-31: qty 1

## 2015-03-31 MED ORDER — FENTANYL CITRATE (PF) 100 MCG/2ML IJ SOLN
INTRAMUSCULAR | Status: DC | PRN
  Administered 2015-03-31 (×2): 50 ug via INTRAVENOUS

## 2015-03-31 MED ORDER — FENTANYL CITRATE (PF) 100 MCG/2ML IJ SOLN
25.0000 ug | INTRAMUSCULAR | Status: DC | PRN
  Administered 2015-03-31 (×2): 25 ug via INTRAVENOUS
  Administered 2015-03-31: 50 ug via INTRAVENOUS

## 2015-03-31 MED ORDER — DEXAMETHASONE SODIUM PHOSPHATE 4 MG/ML IJ SOLN
INTRAMUSCULAR | Status: AC
  Filled 2015-03-31: qty 1

## 2015-03-31 MED ORDER — SCOPOLAMINE 1 MG/3DAYS TD PT72
1.0000 | MEDICATED_PATCH | Freq: Once | TRANSDERMAL | Status: DC
  Administered 2015-03-31: 1.5 mg via TRANSDERMAL

## 2015-03-31 SURGICAL SUPPLY — 14 items
CATH ROBINSON RED A/P 16FR (CATHETERS) ×3 IMPLANT
CLOTH BEACON ORANGE TIMEOUT ST (SAFETY) ×3 IMPLANT
DECANTER SPIKE VIAL GLASS SM (MISCELLANEOUS) ×3 IMPLANT
GLOVE BIO SURGEON STRL SZ7 (GLOVE) ×6 IMPLANT
GOWN STRL REUS W/TWL LRG LVL3 (GOWN DISPOSABLE) ×6 IMPLANT
KIT BERKELEY 1ST TRIMESTER 3/8 (MISCELLANEOUS) ×3 IMPLANT
NS IRRIG 1000ML POUR BTL (IV SOLUTION) ×3 IMPLANT
PACK VAGINAL MINOR WOMEN LF (CUSTOM PROCEDURE TRAY) ×3 IMPLANT
PAD OB MATERNITY 4.3X12.25 (PERSONAL CARE ITEMS) ×3 IMPLANT
PAD PREP 24X48 CUFFED NSTRL (MISCELLANEOUS) ×3 IMPLANT
SET BERKELEY SUCTION TUBING (SUCTIONS) ×3 IMPLANT
SUT CHROMIC 2 0 UR 5 27 (SUTURE) ×3 IMPLANT
TOWEL OR 17X24 6PK STRL BLUE (TOWEL DISPOSABLE) ×6 IMPLANT
VACURETTE 6 ASPIR F TIP BERK (CANNULA) ×3 IMPLANT

## 2015-03-31 NOTE — Transfer of Care (Signed)
Immediate Anesthesia Transfer of Care Note  Patient: [name]  Procedure(s) Performed: Procedure(s): DILATATION AND EVACUATION WITH ULTRASOUND GUIDANCE (N/A)  Patient Location: PACU  Anesthesia Type:MAC  Level of Consciousness: awake, alert , oriented and patient cooperative  Airway & Oxygen Therapy: Patient Spontanous Breathing  Post-op Assessment: Report given to RN and Post -op Vital signs reviewed and stable  Post vital signs: Reviewed and stable  Last Vitals:  Filed Vitals:   03/31/15 1151  BP: 134/78  Pulse: 65  Temp: 36.7 C  Resp: 20    Complications: No apparent anesthesia complications

## 2015-03-31 NOTE — Progress Notes (Signed)
The patient was re-examined with no change in status 

## 2015-03-31 NOTE — Anesthesia Postprocedure Evaluation (Signed)
  Anesthesia Post-op Note  Patient: [name]  Procedure(s) Performed: Procedure(s) (LRB): DILATATION AND EVACUATION WITH ULTRASOUND GUIDANCE (N/A)  Patient Location: PACU  Anesthesia Type: MAC  Level of Consciousness: awake and alert   Airway and Oxygen Therapy: Patient Spontanous Breathing  Post-op Pain: mild  Post-op Assessment: Post-op Vital signs reviewed, Patient's Cardiovascular Status Stable, Respiratory Function Stable, Patent Airway and No signs of Nausea or vomiting  Last Vitals:  Filed Vitals:   03/31/15 1420  BP:   Pulse: 61  Temp:   Resp: 20    Post-op Vital Signs: stable   Complications: No apparent anesthesia complications

## 2015-03-31 NOTE — Addendum Note (Signed)
Addendum  created 03/31/15 1555 by Asher Muir, CRNA   Modules edited: PRL Based Order Sets

## 2015-03-31 NOTE — Anesthesia Preprocedure Evaluation (Signed)
Anesthesia Evaluation  Patient identified by MRN, date of birth, ID band Patient awake    Reviewed: Allergy & Precautions, NPO status , Patient's Chart, lab work & pertinent test results  History of Anesthesia Complications Negative for: history of anesthetic complications  Airway Mallampati: II  TM Distance: >3 FB Neck ROM: Full    Dental no notable dental hx. (+) Dental Advisory Given   Pulmonary neg pulmonary ROS,  breath sounds clear to auscultation  Pulmonary exam normal       Cardiovascular hypertension, negative cardio ROS Normal cardiovascular examRhythm:Regular Rate:Normal     Neuro/Psych negative neurological ROS  negative psych ROS   GI/Hepatic negative GI ROS, Neg liver ROS,   Endo/Other  negative endocrine ROS  Renal/GU negative Renal ROS  negative genitourinary   Musculoskeletal negative musculoskeletal ROS (+)   Abdominal   Peds negative pediatric ROS (+)  Hematology negative hematology ROS (+)   Anesthesia Other Findings   Reproductive/Obstetrics (+) Pregnancy 8 weeks                              Anesthesia Physical Anesthesia Plan  ASA: II  Anesthesia Plan: MAC   Post-op Pain Management:    Induction: Intravenous  Airway Management Planned:   Additional Equipment:   Intra-op Plan:   Post-operative Plan:   Informed Consent: I have reviewed the patients History and Physical, chart, labs and discussed the procedure including the risks, benefits and alternatives for the proposed anesthesia with the patient or authorized representative who has indicated his/her understanding and acceptance.   Dental advisory given  Plan Discussed with: CRNA  Anesthesia Plan Comments:         Anesthesia Quick Evaluation

## 2015-03-31 NOTE — H&P (Signed)
[name], [name] NO.:  [id number]  MEDICAL RECORD NO.:  [id number]  LOCATION:  [facility]                         FACILITY:  [facility]  PHYSICIAN:  Ralene Bathe. Matthew Saras, M.D.DATE OF BIRTH:  1976/09/10  DATE OF ADMISSION:  03/31/2015 DATE OF DISCHARGE:                             HISTORY & PHYSICAL   CHIEF COMPLAINT:  Missed AB.  HISTORY OF PRESENT ILLNESS:  A 39 year old, G8, P2-0-4-2, presents with a history of recurrent pregnancy loss.  She conceived recently, has been on baby aspirin and supplemental first trimester progesterone, but it was determined over the time with ultrasound testing, that she had another missed AB with loss of the FHR, which was noted but was slow early on.  She had elected for Cytotec 800 mcg vaginally, which has now been repeated for the third time without success, presents now for D and E.  This procedure including specific risks related to bleeding, infection, and other complications such as perforation that may require open additional surgery were discussed with her.  The patient has a history of uterus didelphys with a rudimentary horn.  She had a cesarean delivery in 2012 for breech presentation.  Findings at the time showed normal bilateral tubes and ovaries with what appeared to be an unicornuate white-type uterus with a nonconnecting early rudimentary left horn.  This may have been a bicornuate shape without a septum noted.  In most recent ultrasounds, it appears that the pregnancy is more on the left side.  She presents at this time for D and E with ultrasound guidance for missed AB and failed Cytotec.  ALLERGIES:  Sulfa.  CURRENT MEDICATIONS:  Prenatal vitamins, baby aspirin, progesterone.  PAST MEDICAL HISTORY:  Status post history of RPL noted, one prior cesarean.  REVIEW OF SYSTEMS:  Significant for history of headache, mild asthma, and IBS.  FAMILY HISTORY:  Significant for asthma, IBS, thyroid disease, skin and colon  cancer.  SOCIAL HISTORY:  Denies drug or tobacco use.  She does drink socially. She is married.  PHYSICAL EXAMINATION:  VITAL SIGNS:  Temperature 98.2, blood pressure 120/78. HEENT:  Unremarkable. NECK:  Supple without masses. LUNGS:  Clear. CARDIOVASCULAR:  Regular rate and rhythm without murmurs, rubs, or gallops noted. BREASTS:  Without masses. ABDOMEN:  Soft, flat, and nontender.  Vulva, vagina, and cervix normal. Uterus was 7-week size, slightly canted to the right.  Adnexa unremarkable. EXTREMITIES:  Unremarkable. NEUROLOGIC:  Unremarkable.  IMPRESSION:  History of recurrent pregnancy loss, uterine abnormality either bicornuate or a uterus didelphys with a rudimentary horn.  PLAN:  Dilation and evacuation procedure and risks were reviewed as above.  We will plan for dilation and evacuation with ultrasound guidance.     [name] M. Matthew Saras, M.D.     RMH/MEDQ  D:  03/30/2015  T:  03/31/2015  Job:  [id number]

## 2015-03-31 NOTE — Discharge Instructions (Signed)
DISCHARGE INSTRUCTIONS: D&C / D&E The following instructions have been prepared to help you care for yourself upon your return home.   Personal hygiene:  Use sanitary pads for vaginal drainage, not tampons.  Shower the day after your procedure.  NO tub baths, pools or Jacuzzis for 2-3 weeks.  Wipe front to back after using the bathroom.  Activity and limitations:  Do NOT drive or operate any equipment for 24 hours. The effects of anesthesia are still present and drowsiness may result.  Do NOT rest in bed all day.  Walking is encouraged.  Walk up and down stairs slowly.  You may resume your normal activity in one to two days or as indicated by your physician.  Sexual activity: NO intercourse for at least 2 weeks after the procedure, or as indicated by your physician.  Diet: Eat a light meal as desired this evening. You may resume your usual diet tomorrow.  Return to work: You may resume your work activities in one to two days or as indicated by your doctor.  What to expect after your surgery: Expect to have vaginal bleeding/discharge for 2-3 days and spotting for up to 10 days. It is not unusual to have soreness for up to 1-2 weeks. You may have a slight burning sensation when you urinate for the first day. Mild cramps may continue for a couple of days. You may have a regular period in 2-6 weeks.  NO IBUPROFEN PRODUCTS (MOTRIN, ADVIL) OR ALEVE UNTIL 7:30PM TODAY.   Call your doctor for any of the following:  Excessive vaginal bleeding, saturating and changing one pad every hour.  Inability to urinate 6 hours after discharge from hospital.  Pain not relieved by pain medication.  Fever of 100.4 F or greater.  Unusual vaginal discharge or odor.   Call for an appointment:    Patients signature: ______________________  Nurses signature ________________________  Support person's signature_______________________

## 2015-03-31 NOTE — Op Note (Signed)
Preoperative diagnosis: Missed AB, uterus didelphys  Postoperative diagnosis: Same  Procedure: Dilatation and evacuation, paracervical block, ultrasound-guided  Surgeon: Matthew Saras  Anesthesia: Paracervical block plus sedation  Comment complications: None  EBL: Less than 50 cc  Procedure and findings:  Patient taken the operating room after appropriate timeouts were taken with the legs in stirrups the perineum and vagina were prepped and draped in the usual manner for D&E, the bladder was drained, EUA was carried out uterus was 6 weeks size, mid position adnexa negative. Speculum was positioned paracervical block was then created by infiltrating at 3 and 9:00 submucosally, 5-7 cc 1% Xylocaine at each site after negative aspiration. The uterus was sounded to 7-8 cm, initially I #6 small flexible curette was used to try to aspirate tissue from the right side, no tissue was noted there, ultrasound guidance was instituted at this point, which showed the pregnancy sac more to the left, the flexible catheter could be guided by ultrasound into the left cavity suction was then applied and tissue was removed until the sac collapse no further tissue was removed both eyes size were clean and empty at that point. She tolerated this well went to recovery room in good condition.  Dictated with [place].

## 2015-03-31 NOTE — Addendum Note (Signed)
Addendum  created 03/31/15 1541 by Asher Muir, CRNA   Modules edited: PRL Based Order Sets

## 2015-04-01 ENCOUNTER — Encounter (HOSPITAL_COMMUNITY): Payer: Self-pay | Admitting: Obstetrics and Gynecology

## 2015-04-01 LAB — TYPE AND SCREEN
ABO/RH(D): O NEG
Antibody Screen: POSITIVE
DAT, IgG: NEGATIVE
UNIT DIVISION: 0
UNIT DIVISION: 0

## 2015-04-19 ENCOUNTER — Other Ambulatory Visit: Payer: Self-pay | Admitting: Obstetrics and Gynecology

## 2015-04-20 LAB — CYTOLOGY - PAP

## 2016-01-30 ENCOUNTER — Encounter (HOSPITAL_COMMUNITY): Payer: Self-pay | Admitting: *Deleted

## 2017-08-14 ENCOUNTER — Encounter: Payer: Self-pay | Admitting: Sports Medicine

## 2017-08-14 ENCOUNTER — Ambulatory Visit (INDEPENDENT_AMBULATORY_CARE_PROVIDER_SITE_OTHER): Payer: BLUE CROSS/BLUE SHIELD | Admitting: Sports Medicine

## 2017-08-14 VITALS — BP 163/111 | HR 99 | Resp 16 | Ht 62.0 in | Wt 183.0 lb

## 2017-08-14 DIAGNOSIS — Z23 Encounter for immunization: Secondary | ICD-10-CM

## 2017-08-14 DIAGNOSIS — J45909 Unspecified asthma, uncomplicated: Secondary | ICD-10-CM | POA: Insufficient documentation

## 2017-08-14 DIAGNOSIS — I10 Essential (primary) hypertension: Secondary | ICD-10-CM | POA: Diagnosis not present

## 2017-08-14 DIAGNOSIS — J4530 Mild persistent asthma, uncomplicated: Secondary | ICD-10-CM | POA: Diagnosis not present

## 2017-08-14 DIAGNOSIS — Z0000 Encounter for general adult medical examination without abnormal findings: Secondary | ICD-10-CM

## 2017-08-14 DIAGNOSIS — R030 Elevated blood-pressure reading, without diagnosis of hypertension: Secondary | ICD-10-CM | POA: Insufficient documentation

## 2017-08-14 DIAGNOSIS — R635 Abnormal weight gain: Secondary | ICD-10-CM | POA: Insufficient documentation

## 2017-08-14 MED ORDER — LEVOCETIRIZINE DIHYDROCHLORIDE 5 MG PO TABS: 5.0000 mg | ORAL_TABLET | Freq: Every evening | ORAL | 3 refills | Status: DC

## 2017-08-14 MED ORDER — MONTELUKAST SODIUM 10 MG PO TABS: 10.0000 mg | ORAL_TABLET | Freq: Every day | ORAL | 3 refills | Status: DC

## 2017-08-14 NOTE — Assessment & Plan Note (Addendum)
Discontinue Dyazide, we will work aggressively on a sodium free diet. She probably needs an ARB rather than a diuretic. Checking some blood work, CBC and CMP at another office were normal recently.

## 2017-08-14 NOTE — Progress Notes (Signed)
  Subjective:    CC: Establish care.   HPI:  [name] is a pleasant 41 year old female, she is here really to discuss shortness of breath. She does have a history of asthma, has been off of all medications with the exception of albuterol, unfortunately she has been using it up to 3 times per day. No constitutional symptoms, significant upper respiratory allergic-type symptoms with postnasal drip, nasal stuffiness, itchy and watery eyes. Not taking any antihistamines. Symptoms are moderate, persistent.  Hypertension: Was placed on triamterene and hydrochlorothiazide by another provider. Her blood pressure continues to run slightly elevated, no headaches, visual changes, chest pain.  Obesity: Like to try to lose some weight on her own before getting our help.  Past medical history:  Negative.  See flowsheet/record as well for more information.  Surgical history: Negative.  See flowsheet/record as well for more information.  Family history: Negative.  See flowsheet/record as well for more information.  Social history: Negative.  See flowsheet/record as well for more information.  Allergies, and medications have been entered into the medical record, reviewed, and no changes needed.    Review of Systems: No headache, visual changes, nausea, vomiting, diarrhea, constipation, dizziness, abdominal pain, skin rash, fevers, chills, night sweats, swollen lymph nodes, weight loss, chest pain, body aches, joint swelling, muscle aches, shortness of breath, mood changes, visual or auditory hallucinations.  Objective:    General: Well Developed, well nourished, and in no acute distress.  Neuro: Alert and oriented x3, extra-ocular muscles intact, sensation grossly intact.  HEENT: Normocephalic, atraumatic, pupils equal round reactive to light, neck supple, no masses, no lymphadenopathy, thyroid nonpalpable.  Skin: Warm and dry, no rashes noted.  Cardiac: Regular rate and rhythm, no murmurs rubs or gallops.    Respiratory: Clear to auscultation bilaterally. Not using accessory muscles, speaking in full sentences.  Abdominal: Soft, nontender, nondistended, positive bowel sounds, no masses, no organomegaly.  Musculoskeletal: Shoulder, elbow, wrist, hip, knee, ankle stable, and with full range of motion.  Impression and Recommendations:    The patient was counselled, risk factors were discussed, anticipatory guidance given.  Morbid obesity ([place]) She agrees to work on aggressive weight loss, agrees to 10 pounds over 2 months, if unable to achieve this we will set her up with nutrition and start weight loss medication.  Benign essential hypertension Discontinue Dyazide, we will work aggressively on a sodium free diet. She probably needs an ARB rather than a diuretic. Checking some blood work, CBC and CMP at another office were normal recently.  Allergic asthma Continued use Ventolin as needed, using it approximately 21 times per week. Adding Xyzal and Singulair.  Return in one month, we will reevaluate asthma control, if persistent symptoms we will add a daily controller inhaler.  Annual physical exam Up-to-date on cervical cancer screening (1 year ago), declines influenza vaccine, TDap today.  ___________________________________________ Gwen Her. Dianah Field, M.D., ABFM., CAQSM. Primary Care and [place] Instructor of [place] of [facility] of Medicine

## 2017-08-14 NOTE — Assessment & Plan Note (Signed)
Continued use Ventolin as needed, using it approximately 21 times per week. Adding Xyzal and Singulair.  Return in one month, we will reevaluate asthma control, if persistent symptoms we will add a daily controller inhaler.

## 2017-08-14 NOTE — Assessment & Plan Note (Signed)
Up-to-date on cervical cancer screening (1 year ago), declines influenza vaccine, TDap today.

## 2017-08-14 NOTE — Assessment & Plan Note (Signed)
She agrees to work on aggressive weight loss, agrees to 10 pounds over 2 months, if unable to achieve this we will set her up with nutrition and start weight loss medication.

## 2017-08-15 MED ORDER — FEXOFENADINE HCL 180 MG PO TABS: 180.0000 mg | ORAL_TABLET | Freq: Every day | ORAL | 3 refills | Status: DC

## 2017-08-16 LAB — HEMOGLOBIN A1C
Hgb A1c MFr Bld: 4.9 % of total Hgb (ref <5.7)
Mean Plasma Glucose: 94 (calc)
eAG (mmol/L): 5.2 (calc)

## 2017-08-16 LAB — TSH: TSH: 1.27 m[IU]/L

## 2017-08-16 LAB — LIPID PANEL W/REFLEX DIRECT LDL
Cholesterol: 146 mg/dL (ref <200)
HDL: 45 mg/dL — ABNORMAL LOW (ref >50)
LDL Cholesterol (Calc): 84 mg/dL (calc)
Non-HDL Cholesterol (Calc): 101 mg/dL (calc) (ref <130)
Total CHOL/HDL Ratio: 3.2 (calc) (ref <5.0)
Triglycerides: 79 mg/dL (ref <150)

## 2017-08-16 LAB — VITAMIN D 25 HYDROXY (VIT D DEFICIENCY, FRACTURES): Vit D, 25-Hydroxy: 21 ng/mL — ABNORMAL LOW (ref 30–100)

## 2017-08-16 MED ORDER — VITAMIN D (ERGOCALCIFEROL) 1.25 MG (50000 UNIT) PO CAPS: 50000.0000 [IU] | ORAL_CAPSULE | ORAL | 0 refills | Status: DC

## 2017-08-16 NOTE — Addendum Note (Signed)
Addended by: Silverio Decamp on: 08/16/2017 09:05 AM   Modules accepted: Orders

## 2017-09-11 ENCOUNTER — Ambulatory Visit (INDEPENDENT_AMBULATORY_CARE_PROVIDER_SITE_OTHER): Payer: BLUE CROSS/BLUE SHIELD | Admitting: Sports Medicine

## 2017-09-11 ENCOUNTER — Encounter: Payer: Self-pay | Admitting: Sports Medicine

## 2017-09-11 DIAGNOSIS — J4530 Mild persistent asthma, uncomplicated: Secondary | ICD-10-CM

## 2017-09-11 MED ORDER — PHENTERMINE HCL 37.5 MG PO TABS: ORAL_TABLET | ORAL | 0 refills | Status: DC

## 2017-09-11 NOTE — Progress Notes (Signed)
  Subjective:    CC: Asthma  HPI: Tashawnda returns, she was using her albuterol 21 times per month, we added fexofenadine and Singulair, she is down to 2-4 times per month, no nighttime symptoms.  Obesity: Really did not lose any weight in spite of efforts, she is agreeable to start weight loss medication.  Past medical history:  Negative.  See flowsheet/record as well for more information.  Surgical history: Negative.  See flowsheet/record as well for more information.  Family history: Negative.  See flowsheet/record as well for more information.  Social history: Negative.  See flowsheet/record as well for more information.  Allergies, and medications have been entered into the medical record, reviewed, and no changes needed.   Review of Systems: No fevers, chills, night sweats, weight loss, chest pain, or shortness of breath.   Objective:    General: Well Developed, well nourished, and in no acute distress.  Neuro: Alert and oriented x3, extra-ocular muscles intact, sensation grossly intact.  HEENT: Normocephalic, atraumatic, pupils equal round reactive to light, neck supple, no masses, no lymphadenopathy, thyroid nonpalpable.  Skin: Warm and dry, no rashes. Cardiac: Regular rate and rhythm, no murmurs rubs or gallops, no lower extremity edema.  Respiratory: Clear to auscultation bilaterally. Not using accessory muscles, speaking in full sentences.  Impression and Recommendations:    Allergic asthma Fantastic relief with the addition of Singulair, Xyzal was not covered so we did use Allegra, she will make sure it is 180 mg. Only using the inhaler a couple of times over the past month.  Morbid obesity ([place]) Starting phentermine, return monthly for weight checks and refills. Target weight is 150-160 pounds.  I spent 25 minutes with this patient, greater than 50% was face-to-face time counseling regarding the above diagnoses ___________________________________________ Gwen Her.  Dianah Field, M.D., ABFM., CAQSM. Primary Care and [place] Instructor of [place] of [facility] of Medicine

## 2017-09-11 NOTE — Assessment & Plan Note (Signed)
Starting phentermine, return monthly for weight checks and refills. Target weight is 150-160 pounds.

## 2017-09-11 NOTE — Assessment & Plan Note (Signed)
Fantastic relief with the addition of Singulair, Xyzal was not covered so we did use Allegra, she will make sure it is 180 mg. Only using the inhaler a couple of times over the past month.

## 2017-09-12 ENCOUNTER — Encounter: Payer: Self-pay | Admitting: Sports Medicine

## 2017-09-13 ENCOUNTER — Encounter: Payer: Self-pay | Admitting: Sports Medicine

## 2017-10-05 ENCOUNTER — Other Ambulatory Visit: Payer: Self-pay | Admitting: Sports Medicine

## 2017-10-10 ENCOUNTER — Ambulatory Visit: Payer: BLUE CROSS/BLUE SHIELD | Admitting: Sports Medicine

## 2017-10-10 ENCOUNTER — Other Ambulatory Visit: Payer: Self-pay

## 2017-10-10 DIAGNOSIS — J4530 Mild persistent asthma, uncomplicated: Secondary | ICD-10-CM

## 2017-10-10 MED ORDER — MONTELUKAST SODIUM 10 MG PO TABS: 10.0000 mg | ORAL_TABLET | Freq: Every day | ORAL | 1 refills | Status: DC

## 2017-10-11 ENCOUNTER — Ambulatory Visit (INDEPENDENT_AMBULATORY_CARE_PROVIDER_SITE_OTHER): Payer: BLUE CROSS/BLUE SHIELD | Admitting: Sports Medicine

## 2017-10-11 ENCOUNTER — Encounter: Payer: Self-pay | Admitting: Sports Medicine

## 2017-10-11 ENCOUNTER — Other Ambulatory Visit: Payer: Self-pay | Admitting: Sports Medicine

## 2017-10-11 DIAGNOSIS — J4530 Mild persistent asthma, uncomplicated: Secondary | ICD-10-CM

## 2017-10-11 MED ORDER — MONTELUKAST SODIUM 10 MG PO TABS: 10.0000 mg | ORAL_TABLET | Freq: Every day | ORAL | 3 refills | Status: DC

## 2017-10-11 MED ORDER — PHENTERMINE HCL 37.5 MG PO TABS: ORAL_TABLET | ORAL | 0 refills | Status: DC

## 2017-10-11 NOTE — Assessment & Plan Note (Signed)
6 pound weight loss after the first month, refilling phentermine, entering the second month.

## 2017-10-11 NOTE — Progress Notes (Signed)
  Subjective:    CC: Weight check  HPI: This is a pleasant 41-year-old female, she has finished her first month of phentermine and has lost 6 pounds, doing well.  Allergic asthma: Fantastic relief with high-dose fexofenadine and Singulair.  Past medical history:  Negative.  See flowsheet/record as well for more information.  Surgical history: Negative.  See flowsheet/record as well for more information.  Family history: Negative.  See flowsheet/record as well for more information.  Social history: Negative.  See flowsheet/record as well for more information.  Allergies, and medications have been entered into the medical record, reviewed, and no changes needed.   (To billers/coders, pertinent past medical, social, surgical, family history can be found in problem list, if problem list is marked as reviewed then this indicates that past medical, social, surgical, family history was also reviewed)  Review of Systems: No fevers, chills, night sweats, weight loss, chest pain, or shortness of breath.   Objective:    General: Well Developed, well nourished, and in no acute distress.  Neuro: Alert and oriented x3, extra-ocular muscles intact, sensation grossly intact.  HEENT: Normocephalic, atraumatic, pupils equal round reactive to light, neck supple, no masses, no lymphadenopathy, thyroid nonpalpable.  Skin: Warm and dry, no rashes. Cardiac: Regular rate and rhythm, no murmurs rubs or gallops, no lower extremity edema.  Respiratory: Clear to auscultation bilaterally. Not using accessory muscles, speaking in full sentences.  Impression and Recommendations:    Allergic asthma Controlled now with Allegra and Singulair.  Morbid obesity ([place]) 6 pound weight loss after the first month, refilling phentermine, entering the second month.  ___________________________________________ Gwen Her. Dianah Field, M.D., ABFM., CAQSM. Primary Care and [place] Instructor of [place] of [facility] of Medicine

## 2017-10-11 NOTE — Assessment & Plan Note (Signed)
Controlled now with Allegra and Singulair.

## 2017-11-08 ENCOUNTER — Encounter: Payer: Self-pay | Admitting: Sports Medicine

## 2017-11-08 ENCOUNTER — Ambulatory Visit (INDEPENDENT_AMBULATORY_CARE_PROVIDER_SITE_OTHER): Payer: BLUE CROSS/BLUE SHIELD | Admitting: Sports Medicine

## 2017-11-08 DIAGNOSIS — R635 Abnormal weight gain: Secondary | ICD-10-CM

## 2017-11-08 DIAGNOSIS — I10 Essential (primary) hypertension: Secondary | ICD-10-CM

## 2017-11-08 MED ORDER — PHENTERMINE HCL 37.5 MG PO TABS: ORAL_TABLET | ORAL | 0 refills | Status: DC

## 2017-11-08 NOTE — Progress Notes (Addendum)
Subjective:    CC: Weight check  HPI: Has done fantastic over the second month, has lost an additional 11 pounds, no adverse effects, doing well.  Blood pressure is somewhat elevated, she tells me that the numbers are not as bad at home. Mild headache, no visual changes, chest pain.  I reviewed the past medical history, family history, social history, surgical history, and allergies today and no changes were needed.  Please see the problem list section below in epic for further details.  Past Medical History: Past Medical History:  Diagnosis Date  . Asthma    rare inhaler use  . Headache(784.0)    migraines  . IBS (irritable bowel syndrome)   . Miscarriage 04/2013  . Pregnancy induced hypertension    Hx with both previous pregnancies, resolved after delivery  . SVD (spontaneous vaginal delivery)    x 2  . Uterus didelphus    Past Surgical History: Past Surgical History:  Procedure Laterality Date  . CESAREAN SECTION  08/31/2011   Procedure: CESAREAN SECTION;  Surgeon: Margarette Asal;  Location: [facility];  Service: Gynecology;  Laterality: N/A;  primary  . DILATION AND CURETTAGE OF UTERUS     x 2  . DILATION AND EVACUATION N/A 03/31/2015   Procedure: DILATATION AND EVACUATION WITH ULTRASOUND GUIDANCE;  Surgeon: Molli Posey, MD;  Location: [facility];  Service: Gynecology;  Laterality: N/A;  . HEMORRHOID SURGERY  2008  . WISDOM TOOTH EXTRACTION     Social History: Social History   Socioeconomic History  . Marital status: Married    Spouse name: Juanito Doom"  . Number of children: 3  . Years of education: College  . Highest education level: None  Social Needs  . Financial resource strain: None  . Food insecurity - worry: None  . Food insecurity - inability: None  . Transportation needs - medical: None  . 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 Smoking status: Never Smoker  .  Smokeless tobacco: Never Used  Substance and Sexual Activity  . Alcohol use: Yes    Comment: 1 glass of wine nightly; beer occasionally, none with pregnancy  . Drug use: No  . Sexual activity: Yes    Birth control/protection: None    Comment: approx [redacted] wks gestation  Other Topics Concern  . None  Social History Narrative   Patient lives at home with family.   Caffeine Use 1 cup daily   Family History: Family History  Problem Relation Age of Onset  . Hypertension Mother   . Thyroid disease Mother   . Cancer Mother        skin  . Hashimoto's thyroiditis Mother   . Cancer Maternal Grandmother        colon  . Cancer Maternal Grandfather        leaukemia  . Hypotension Neg Hx   . Malignant hyperthermia Neg Hx   . Pseudochol deficiency Neg Hx   . Anesthesia problems Cousin        difficulty waking up from anesthesia as child  . Migraines Mother   . Hypertension Mother   . Thyroid disease Maternal Grandmother   . Thyroid disease Cousin   . Thyroid disease Maternal Aunt   . Bone cancer Paternal Grandmother    Allergies: Allergies  Allergen Reactions  . Sulfa Antibiotics Rash   Medications: See med rec.  Review of Systems:  No fevers, chills, night sweats, weight loss, chest pain, or shortness of breath.   Objective:    General: Well Developed, well nourished, and in no acute distress.  Neuro: Alert and oriented x3, extra-ocular muscles intact, sensation grossly intact.  HEENT: Normocephalic, atraumatic, pupils equal round reactive to light, neck supple, no masses, no lymphadenopathy, thyroid nonpalpable.  Skin: Warm and dry, no rashes. Cardiac: Regular rate and rhythm, no murmurs rubs or gallops, no lower extremity edema.  Respiratory: Clear to auscultation bilaterally. Not using accessory muscles, speaking in full sentences.  Impression and Recommendations:    Morbid obesity ([place]) Additional 11 pound weight loss after the second month. Entering the third month of  phentermine.  Benign essential hypertension Blood pressure is elevated today, she will work on a low-sodium diet and if persistently elevated at the next visit we will start an ARB. She will also keep a diary of her blood pressures at home.  I spent 25 minutes with this patient, greater than 50% was face-to-face time counseling regarding the above diagnoses ___________________________________________ Gwen Her. Dianah Field, M.D., ABFM., CAQSM. Primary Care and [place] Instructor of [place] of [facility] of Medicine

## 2017-11-08 NOTE — Assessment & Plan Note (Signed)
Additional 11 pound weight loss after the second month. Entering the third month of phentermine.

## 2017-11-08 NOTE — Assessment & Plan Note (Signed)
Blood pressure is elevated today, she will work on a low-sodium diet and if persistently elevated at the next visit we will start an ARB. She will also keep a diary of her blood pressures at home.

## 2017-11-08 NOTE — Addendum Note (Signed)
Addended by: Silverio Decamp on: 11/08/2017 11:22 AM   Modules accepted: Level of Service

## 2017-12-05 ENCOUNTER — Ambulatory Visit (INDEPENDENT_AMBULATORY_CARE_PROVIDER_SITE_OTHER): Payer: BLUE CROSS/BLUE SHIELD | Admitting: Sports Medicine

## 2017-12-05 ENCOUNTER — Encounter: Payer: Self-pay | Admitting: Sports Medicine

## 2017-12-05 DIAGNOSIS — R635 Abnormal weight gain: Secondary | ICD-10-CM | POA: Diagnosis not present

## 2017-12-05 DIAGNOSIS — R030 Elevated blood-pressure reading, without diagnosis of hypertension: Secondary | ICD-10-CM | POA: Diagnosis not present

## 2017-12-05 MED ORDER — PHENTERMINE HCL 37.5 MG PO TABS: ORAL_TABLET | ORAL | 0 refills | Status: DC

## 2017-12-05 NOTE — Assessment & Plan Note (Signed)
Additional 4 pound weight loss after the third month. Entering the fourth month.

## 2017-12-05 NOTE — Assessment & Plan Note (Signed)
Blood pressure readings at home are typically 336P-224S systolic over 97N-30Y. We will keep an eye on this.

## 2017-12-05 NOTE — Progress Notes (Signed)
Subjective:    CC: Weight check  HPI: [name] returns, she is done well with phentermine, most recently she did lose a whole lot of weight, 4 pounds but has been losing every visit.  Desires to continue.  Elevated blood pressure: I looked at her blood pressure logs from home, the consistently run between the 950D-326Z systolic over 12W-58K diastolic, no headaches, visual changes, chest pain, blood pressure simply are elevated here in the office.  I reviewed the past medical history, family history, social history, surgical history, and allergies today and no changes were needed.  Please see the problem list section below in epic for further details.  Past Medical History: Past Medical History:  Diagnosis Date  . Asthma    rare inhaler use  . Headache(784.0)    migraines  . IBS (irritable bowel syndrome)   . Miscarriage 04/2013  . Pregnancy induced hypertension    Hx with both previous pregnancies, resolved after delivery  . SVD (spontaneous vaginal delivery)    x 2  . Uterus didelphus    Past Surgical History: Past Surgical History:  Procedure Laterality Date  . CESAREAN SECTION  08/31/2011   Procedure: CESAREAN SECTION;  Surgeon: Margarette Asal;  Location: [facility];  Service: Gynecology;  Laterality: N/A;  primary  . DILATION AND CURETTAGE OF UTERUS     x 2  . DILATION AND EVACUATION N/A 03/31/2015   Procedure: DILATATION AND EVACUATION WITH ULTRASOUND GUIDANCE;  Surgeon: Molli Posey, MD;  Location: [facility];  Service: Gynecology;  Laterality: N/A;  . HEMORRHOID SURGERY  2008  . WISDOM TOOTH EXTRACTION     Social History: Social History   Socioeconomic History  . Marital status: Married    Spouse name: [name]"  . Number of children: 3  . Years of education: College  . Highest education level: None  Social Needs  . Financial resource strain: None  . Food insecurity - worry: None  . Food insecurity - inability: None  . Transportation needs - medical: None  .  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 Smoking status: Never Smoker  . Smokeless tobacco: Never Used  Substance and Sexual Activity  . Alcohol use: Yes    Comment: 1 glass of wine nightly; beer occasionally, none with pregnancy  . Drug use: No  . Sexual activity: Yes    Birth control/protection: None    Comment: approx [redacted] wks gestation  Other Topics Concern  . None  Social History Narrative   Patient lives at home with family.   Caffeine Use 1 cup daily   Family History: Family History  Problem Relation Age of Onset  . Hypertension Mother   . Thyroid disease Mother   . Cancer Mother        skin  . Hashimoto's thyroiditis Mother   . Cancer Maternal Grandmother        colon  . Cancer Maternal Grandfather        leaukemia  . Hypotension Neg Hx   . Malignant hyperthermia Neg Hx   . Pseudochol deficiency Neg Hx   . Anesthesia problems Cousin        difficulty waking up from anesthesia as child  . Migraines Mother   . Hypertension Mother   . Thyroid disease Maternal Grandmother   . Thyroid disease Cousin   . Thyroid disease Maternal Aunt   . Bone cancer  Paternal Grandmother    Allergies: Allergies  Allergen Reactions  . Sulfa Antibiotics Rash   Medications: See med rec.  Review of Systems: No fevers, chills, night sweats, weight loss, chest pain, or shortness of breath.   Objective:    General: Well Developed, well nourished, and in no acute distress.  Neuro: Alert and oriented x3, extra-ocular muscles intact, sensation grossly intact.  HEENT: Normocephalic, atraumatic, pupils equal round reactive to light, neck supple, no masses, no lymphadenopathy, thyroid nonpalpable.  Skin: Warm and dry, no rashes. Cardiac: Regular rate and rhythm, no murmurs rubs or gallops, no lower extremity edema.  Respiratory: Clear to auscultation bilaterally. Not using accessory muscles, speaking in  full sentences.  Impression and Recommendations:    Morbid obesity ([place]) Additional 4 pound weight loss after the third month. Entering the fourth month.  White coat syndrome with high blood pressure but without hypertension Blood pressure readings at home are typically 026V-785Y systolic over 85O-27X. We will keep an eye on this. ___________________________________________ Gwen Her. Dianah Field, M.D., ABFM., CAQSM. Primary Care and [place] Instructor of [place] of [facility] of Medicine

## 2017-12-07 ENCOUNTER — Telehealth: Payer: Self-pay | Admitting: Sports Medicine

## 2017-12-07 NOTE — Telephone Encounter (Signed)
Received fax for PA on Phentermine sent through cover my meds and received authorization. I will notify pharmacy.   Case ID: [id number] Valid: 11/07/17 - 12/07/2018

## 2018-01-02 ENCOUNTER — Encounter: Payer: Self-pay | Admitting: Sports Medicine

## 2018-01-02 ENCOUNTER — Ambulatory Visit (INDEPENDENT_AMBULATORY_CARE_PROVIDER_SITE_OTHER): Payer: BLUE CROSS/BLUE SHIELD | Admitting: Sports Medicine

## 2018-01-02 DIAGNOSIS — R635 Abnormal weight gain: Secondary | ICD-10-CM

## 2018-01-02 MED ORDER — PHENTERMINE HCL 37.5 MG PO TABS
ORAL_TABLET | ORAL | 0 refills | Status: DC
Start: 2018-01-02 — End: 2018-01-30

## 2018-01-02 NOTE — Assessment & Plan Note (Signed)
Additional 5 pound weight loss, refilling phentermine.   Return in 1 month.   Entering the fifth month.

## 2018-01-02 NOTE — Progress Notes (Signed)
Subjective:    CC: Weight check  HPI: Additional 5 pound weight loss after the fourth month of phentermine, no adverse effects.   Blood pressure was initially elevated but improved considerably on the recheck.  I reviewed the past medical history, family history, social history, surgical history, and allergies today and no changes were needed.  Please see the problem list section below in epic for further details.  Past Medical History: Past Medical History:  Diagnosis Date  . Asthma    rare inhaler use  . Headache(784.0)    migraines  . IBS (irritable bowel syndrome)   . Miscarriage 04/2013  . Pregnancy induced hypertension    Hx with both previous pregnancies, resolved after delivery  . SVD (spontaneous vaginal delivery)    x 2  . Uterus didelphus    Past Surgical History: Past Surgical History:  Procedure Laterality Date  . CESAREAN SECTION  08/31/2011   Procedure: CESAREAN SECTION;  Surgeon: Margarette Asal;  Location: [facility];  Service: Gynecology;  Laterality: N/A;  primary  . DILATION AND CURETTAGE OF UTERUS     x 2  . DILATION AND EVACUATION N/A 03/31/2015   Procedure: DILATATION AND EVACUATION WITH ULTRASOUND GUIDANCE;  Surgeon: Molli Posey, MD;  Location: [facility];  Service: Gynecology;  Laterality: N/A;  . HEMORRHOID SURGERY  2008  . WISDOM TOOTH EXTRACTION     Social History: Social History   Socioeconomic History  . Marital status: Married    Spouse name: Juanito Doom"  . Number of children: 3  . Years of education: College  . Highest education level: None  Social Needs  . Financial resource strain: None  . Food insecurity - worry: None  . Food insecurity - inability: None  . Transportation needs - medical: None  . 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 Smoking status: Never Smoker  . Smokeless tobacco: Never Used  Substance and Sexual Activity  . Alcohol use:  Yes    Comment: 1 glass of wine nightly; beer occasionally, none with pregnancy  . Drug use: No  . Sexual activity: Yes    Birth control/protection: None    Comment: approx [redacted] wks gestation  Other Topics Concern  . None  Social History Narrative   Patient lives at home with family.   Caffeine Use 1 cup daily   Family History: Family History  Problem Relation Age of Onset  . Hypertension Mother   . Thyroid disease Mother   . Cancer Mother        skin  . Hashimoto's thyroiditis Mother   . Cancer Maternal Grandmother        colon  . Cancer Maternal Grandfather        leaukemia  . Hypotension Neg Hx   . Malignant hyperthermia Neg Hx   . Pseudochol deficiency Neg Hx   . Anesthesia problems Cousin        difficulty waking up from anesthesia as child  . Migraines Mother   . Hypertension Mother   . Thyroid disease Maternal Grandmother   . Thyroid disease Cousin   . Thyroid disease Maternal Aunt   . Bone cancer Paternal Grandmother    Allergies: Allergies  Allergen Reactions  . Sulfa Antibiotics Rash   Medications: See med rec.  Review of Systems: No fevers, chills, night sweats, weight loss, chest pain, or shortness of breath.   Objective:  General: Well Developed, well nourished, and in no acute distress.  Neuro: Alert and oriented x3, extra-ocular muscles intact, sensation grossly intact.  HEENT: Normocephalic, atraumatic, pupils equal round reactive to light, neck supple, no masses, no lymphadenopathy, thyroid nonpalpable.  Skin: Warm and dry, no rashes. Cardiac: Regular rate and rhythm, no murmurs rubs or gallops, no lower extremity edema.  Respiratory: Clear to auscultation bilaterally. Not using accessory muscles, speaking in full sentences.  Impression and Recommendations:    Morbid obesity ([place]) Additional 5 pound weight loss, refilling phentermine.   Return in 1 month.   Entering the fifth month. ___________________________________________ Gwen Her.  Dianah Field, M.D., ABFM., CAQSM. Primary Care and [place] Instructor of [place] of [facility] of Medicine

## 2018-01-25 ENCOUNTER — Encounter: Payer: Self-pay | Admitting: Sports Medicine

## 2018-01-30 ENCOUNTER — Encounter: Payer: Self-pay | Admitting: Sports Medicine

## 2018-01-30 ENCOUNTER — Ambulatory Visit (INDEPENDENT_AMBULATORY_CARE_PROVIDER_SITE_OTHER): Payer: BLUE CROSS/BLUE SHIELD | Admitting: Sports Medicine

## 2018-01-30 DIAGNOSIS — R635 Abnormal weight gain: Secondary | ICD-10-CM

## 2018-01-30 MED ORDER — PHENTERMINE HCL 37.5 MG PO TABS: ORAL_TABLET | ORAL | 0 refills | Status: DC

## 2018-01-30 NOTE — Assessment & Plan Note (Signed)
Additional 8 pound weight loss as we are entering the sixth month, she's lost all most 40 pounds. Return in one month.

## 2018-01-30 NOTE — Progress Notes (Signed)
Subjective:    CC: Weight check  HPI: [name] returns, she has lost almost 40 pounds now after 5 full months of phentermine.  Happy with how things are going.  I reviewed the past medical history, family history, social history, surgical history, and allergies today and no changes were needed.  Please see the problem list section below in epic for further details.  Past Medical History: Past Medical History:  Diagnosis Date  . Asthma    rare inhaler use  . Headache(784.0)    migraines  . IBS (irritable bowel syndrome)   . Miscarriage 04/2013  . Pregnancy induced hypertension    Hx with both previous pregnancies, resolved after delivery  . SVD (spontaneous vaginal delivery)    x 2  . Uterus didelphus    Past Surgical History: Past Surgical History:  Procedure Laterality Date  . CESAREAN SECTION  08/31/2011   Procedure: CESAREAN SECTION;  Surgeon: Margarette Asal;  Location: [facility];  Service: Gynecology;  Laterality: N/A;  primary  . DILATION AND CURETTAGE OF UTERUS     x 2  . DILATION AND EVACUATION N/A 03/31/2015   Procedure: DILATATION AND EVACUATION WITH ULTRASOUND GUIDANCE;  Surgeon: Molli Posey, MD;  Location: [facility];  Service: Gynecology;  Laterality: N/A;  . HEMORRHOID SURGERY  2008  . WISDOM TOOTH EXTRACTION     Social History: Social History   Socioeconomic History  . Marital status: Married    Spouse name: Juanito Doom"  . Number of children: 3  . Years of education: College  . 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 Financial resource strain: Not on file  . Food insecurity:    Worry: Not on file    Inability: Not on file  . Transportation needs:    Medical: Not on file    Non-medical: Not on file  Tobacco Use  . Smoking status: Never Smoker  . Smokeless tobacco: Never Used  Substance and Sexual Activity  . Alcohol use: Yes    Comment: 1 glass of wine nightly; beer  occasionally, none with pregnancy  . Drug use: No  . Sexual activity: Yes    Birth control/protection: None    Comment: approx [redacted] wks gestation  Lifestyle  . Physical activity:    Days per week: Not on file    Minutes per session: Not on file  . Stress: Not on file  Relationships  . Social connections:    Talks on phone: Not on file    Gets together: Not on file    Attends religious service: Not on file    Active member of club or organization: Not on file    Attends meetings of clubs or organizations: Not on file    Relationship status: Not on file  Other Topics Concern  . Not on file  Social History Narrative   Patient lives at home with family.   Caffeine Use 1 cup daily   Family History: Family History  Problem Relation Age of Onset  . Hypertension Mother   . Thyroid disease Mother   . Cancer Mother        skin  . Hashimoto's thyroiditis Mother   . Cancer Maternal Grandmother        colon  . Cancer Maternal Grandfather        leaukemia  . Hypotension Neg Hx   . Malignant hyperthermia Neg  Hx   . Pseudochol deficiency Neg Hx   . Anesthesia problems Cousin        difficulty waking up from anesthesia as child  . Migraines Mother   . Hypertension Mother   . Thyroid disease Maternal Grandmother   . Thyroid disease Cousin   . Thyroid disease Maternal Aunt   . Bone cancer Paternal Grandmother    Allergies: Allergies  Allergen Reactions  . Sulfa Antibiotics Rash   Medications: See med rec.  Review of Systems: No fevers, chills, night sweats, weight loss, chest pain, or shortness of breath.   Objective:    General: Well Developed, well nourished, and in no acute distress.  Neuro: Alert and oriented x3, extra-ocular muscles intact, sensation grossly intact.  HEENT: Normocephalic, atraumatic, pupils equal round reactive to light, neck supple, no masses, no lymphadenopathy, thyroid nonpalpable.  Skin: Warm and dry, no rashes. Cardiac: Regular rate and rhythm, no  murmurs rubs or gallops, no lower extremity edema.  Respiratory: Clear to auscultation bilaterally. Not using accessory muscles, speaking in full sentences.  Impression and Recommendations:    Abnormal weight gain Additional 8 pound weight loss as we are entering the sixth month, she's lost all most 40 pounds. Return in one month. ___________________________________________ Gwen Her. Dianah Field, M.D., ABFM., CAQSM. Primary Care and [place] Instructor of [place] of [facility] of Medicine

## 2018-02-21 ENCOUNTER — Encounter: Payer: Self-pay | Admitting: Sports Medicine

## 2018-02-27 ENCOUNTER — Ambulatory Visit (INDEPENDENT_AMBULATORY_CARE_PROVIDER_SITE_OTHER): Payer: BLUE CROSS/BLUE SHIELD | Admitting: Sports Medicine

## 2018-02-27 ENCOUNTER — Encounter: Payer: Self-pay | Admitting: Sports Medicine

## 2018-02-27 DIAGNOSIS — R635 Abnormal weight gain: Secondary | ICD-10-CM

## 2018-02-27 MED ORDER — TOPIRAMATE 50 MG PO TABS: ORAL_TABLET | ORAL | 0 refills | Status: DC

## 2018-02-27 MED ORDER — PHENTERMINE HCL 37.5 MG PO TABS: ORAL_TABLET | ORAL | 0 refills | Status: DC

## 2018-02-27 NOTE — Progress Notes (Signed)
Subjective:    CC: Follow-up  HPI: After 6 months of phentermine she has lost 40 pounds, happy with results.  I reviewed the past medical history, family history, social history, surgical history, and allergies today and no changes were needed.  Please see the problem list section below in epic for further details.  Past Medical History: Past Medical History:  Diagnosis Date  . Asthma    rare inhaler use  . Headache(784.0)    migraines  . IBS (irritable bowel syndrome)   . Miscarriage 04/2013  . Pregnancy induced hypertension    Hx with both previous pregnancies, resolved after delivery  . SVD (spontaneous vaginal delivery)    x 2  . Uterus didelphus    Past Surgical History: Past Surgical History:  Procedure Laterality Date  . CESAREAN SECTION  08/31/2011   Procedure: CESAREAN SECTION;  Surgeon: Margarette Asal;  Location: [facility];  Service: Gynecology;  Laterality: N/A;  primary  . DILATION AND CURETTAGE OF UTERUS     x 2  . DILATION AND EVACUATION N/A 03/31/2015   Procedure: DILATATION AND EVACUATION WITH ULTRASOUND GUIDANCE;  Surgeon: Molli Posey, MD;  Location: [facility];  Service: Gynecology;  Laterality: N/A;  . HEMORRHOID SURGERY  2008  . WISDOM TOOTH EXTRACTION     Social History: Social History   Socioeconomic History  . Marital status: Married    Spouse name: Juanito Doom"  . Number of children: 3  . Years of education: College  . 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 Financial resource strain: Not on file  . Food insecurity:    Worry: Not on file    Inability: Not on file  . Transportation needs:    Medical: Not on file    Non-medical: Not on file  Tobacco Use  . Smoking status: Never Smoker  . Smokeless tobacco: Never Used  Substance and Sexual Activity  . Alcohol use: Yes    Comment: 1 glass of wine nightly; beer occasionally, none with pregnancy  . Drug use:  No  . Sexual activity: Yes    Birth control/protection: None    Comment: approx [redacted] wks gestation  Lifestyle  . Physical activity:    Days per week: Not on file    Minutes per session: Not on file  . Stress: Not on file  Relationships  . Social connections:    Talks on phone: Not on file    Gets together: Not on file    Attends religious service: Not on file    Active member of club or organization: Not on file    Attends meetings of clubs or organizations: Not on file    Relationship status: Not on file  Other Topics Concern  . Not on file  Social History Narrative   Patient lives at home with family.   Caffeine Use 1 cup daily   Family History: Family History  Problem Relation Age of Onset  . Hypertension Mother   . Thyroid disease Mother   . Cancer Mother        skin  . Hashimoto's thyroiditis Mother   . Cancer Maternal Grandmother        colon  . Cancer Maternal Grandfather        leaukemia  . Hypotension Neg Hx   . Malignant hyperthermia Neg Hx   . Pseudochol deficiency Neg Hx   .  Anesthesia problems Cousin        difficulty waking up from anesthesia as child  . Migraines Mother   . Hypertension Mother   . Thyroid disease Maternal Grandmother   . Thyroid disease Cousin   . Thyroid disease Maternal Aunt   . Bone cancer Paternal Grandmother    Allergies: Allergies  Allergen Reactions  . Sulfa Antibiotics Rash   Medications: See med rec.  Review of Systems: No fevers, chills, night sweats, weight loss, chest pain, or shortness of breath.   Objective:    General: Well Developed, well nourished, and in no acute distress.  Neuro: Alert and oriented x3, extra-ocular muscles intact, sensation grossly intact.  HEENT: Normocephalic, atraumatic, pupils equal round reactive to light, neck supple, no masses, no lymphadenopathy, thyroid nonpalpable.  Skin: Warm and dry, no rashes. Cardiac: Regular rate and rhythm, no murmurs rubs or gallops, no lower extremity  edema.  Respiratory: Clear to auscultation bilaterally. Not using accessory muscles, speaking in full sentences.  Impression and Recommendations:    Abnormal weight gain Approximate 40 pound weight loss after 6 months of phentermine, she is done extremely well. Dropping down to one half tab, adding Topamax. Return in 3 months. ___________________________________________ Gwen Her. Dianah Field, M.D., ABFM., CAQSM. Primary Care and [place] Instructor of [place] of [facility] of Medicine

## 2018-02-27 NOTE — Assessment & Plan Note (Signed)
Approximate 40 pound weight loss after 6 months of phentermine, she is done extremely well. Dropping down to one half tab, adding Topamax. Return in 3 months.

## 2018-05-03 ENCOUNTER — Encounter: Payer: Self-pay | Admitting: Sports Medicine

## 2018-05-30 ENCOUNTER — Ambulatory Visit: Payer: BLUE CROSS/BLUE SHIELD | Admitting: Sports Medicine

## 2018-06-06 ENCOUNTER — Encounter: Payer: Self-pay | Admitting: Sports Medicine

## 2018-06-06 ENCOUNTER — Ambulatory Visit (INDEPENDENT_AMBULATORY_CARE_PROVIDER_SITE_OTHER): Payer: BLUE CROSS/BLUE SHIELD | Admitting: Sports Medicine

## 2018-06-06 DIAGNOSIS — R635 Abnormal weight gain: Secondary | ICD-10-CM

## 2018-06-06 DIAGNOSIS — R030 Elevated blood-pressure reading, without diagnosis of hypertension: Secondary | ICD-10-CM | POA: Diagnosis not present

## 2018-06-06 MED ORDER — PHENTERMINE HCL 37.5 MG PO TABS: ORAL_TABLET | ORAL | 0 refills | Status: DC

## 2018-06-06 NOTE — Assessment & Plan Note (Signed)
Patient will continue to keep a diary of her blood pressures, she tells me they generally run normal at home. Decrease sodium in the diet, increase exercise, and if blood pressures at home are consistently high we will start lisinopril.

## 2018-06-06 NOTE — Progress Notes (Signed)
Subjective:    CC: Follow-up  HPI: Obesity: Has maintained weight during the down taper of phentermine, did not yet take her Topamax.  Elevated blood pressure: Tends to run normal at home.  No headaches, visual changes, chest pain.  I reviewed the past medical history, family history, social history, surgical history, and allergies today and no changes were needed.  Please see the problem list section below in epic for further details.  Past Medical History: Past Medical History:  Diagnosis Date  . Asthma    rare inhaler use  . Headache(784.0)    migraines  . IBS (irritable bowel syndrome)   . Miscarriage 04/2013  . Pregnancy induced hypertension    Hx with both previous pregnancies, resolved after delivery  . SVD (spontaneous vaginal delivery)    x 2  . Uterus didelphus    Past Surgical History: Past Surgical History:  Procedure Laterality Date  . CESAREAN SECTION  08/31/2011   Procedure: CESAREAN SECTION;  Surgeon: Margarette Asal;  Location: [facility];  Service: Gynecology;  Laterality: N/A;  primary  . DILATION AND CURETTAGE OF UTERUS     x 2  . DILATION AND EVACUATION N/A 03/31/2015   Procedure: DILATATION AND EVACUATION WITH ULTRASOUND GUIDANCE;  Surgeon: Molli Posey, MD;  Location: [facility];  Service: Gynecology;  Laterality: N/A;  . HEMORRHOID SURGERY  2008  . WISDOM TOOTH EXTRACTION     Social History: Social History   Socioeconomic History  . Marital status: Married    Spouse name: Juanito Doom"  . Number of children: 3  . Years of education: College  . 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 Financial resource strain: Not on file  . Food insecurity:    Worry: Not on file    Inability: Not on file  . Transportation needs:    Medical: Not on file    Non-medical: Not on file  Tobacco Use  . Smoking status: Never Smoker  . Smokeless tobacco: Never Used  Substance and  Sexual Activity  . Alcohol use: Yes    Comment: 1 glass of wine nightly; beer occasionally, none with pregnancy  . Drug use: No  . Sexual activity: Yes    Birth control/protection: None    Comment: approx [redacted] wks gestation  Lifestyle  . Physical activity:    Days per week: Not on file    Minutes per session: Not on file  . Stress: Not on file  Relationships  . Social connections:    Talks on phone: Not on file    Gets together: Not on file    Attends religious service: Not on file    Active member of club or organization: Not on file    Attends meetings of clubs or organizations: Not on file    Relationship status: Not on file  Other Topics Concern  . Not on file  Social History Narrative   Patient lives at home with family.   Caffeine Use 1 cup daily   Family History: Family History  Problem Relation Age of Onset  . Hypertension Mother   . Thyroid disease Mother   . Cancer Mother        skin  . Hashimoto's thyroiditis Mother   . Cancer Maternal Grandmother        colon  . Cancer Maternal Grandfather        leaukemia  .  Hypotension Neg Hx   . Malignant hyperthermia Neg Hx   . Pseudochol deficiency Neg Hx   . Anesthesia problems Cousin        difficulty waking up from anesthesia as child  . Migraines Mother   . Hypertension Mother   . Thyroid disease Maternal Grandmother   . Thyroid disease Cousin   . Thyroid disease Maternal Aunt   . Bone cancer Paternal Grandmother    Allergies: Allergies  Allergen Reactions  . Sulfa Antibiotics Rash   Medications: See med rec.  Review of Systems: No fevers, chills, night sweats, weight loss, chest pain, or shortness of breath.   Objective:    General: Well Developed, well nourished, and in no acute distress.  Neuro: Alert and oriented x3, extra-ocular muscles intact, sensation grossly intact.  HEENT: Normocephalic, atraumatic, pupils equal round reactive to light, neck supple, no masses, no lymphadenopathy, thyroid  nonpalpable.  Skin: Warm and dry, no rashes. Cardiac: Regular rate and rhythm, no murmurs rubs or gallops, no lower extremity edema.  Respiratory: Clear to auscultation bilaterally. Not using accessory muscles, speaking in full sentences.  Impression and Recommendations:    Abnormal weight gain Weight has remained consistent, refilling with the final 3 months of half dose phentermine. She did not yet take her Topamax.   White coat syndrome with high blood pressure but without hypertension Patient will continue to keep a diary of her blood pressures, she tells me they generally run normal at home. Decrease sodium in the diet, increase exercise, and if blood pressures at home are consistently high we will start lisinopril.  I spent 25 minutes with this patient, greater than 50% was face-to-face time counseling regarding the above diagnoses ___________________________________________ Gwen Her. Dianah Field, M.D., ABFM., CAQSM. Primary Care and [place] Instructor of [place] of [facility] of Medicine

## 2018-06-06 NOTE — Assessment & Plan Note (Signed)
Weight has remained consistent, refilling with the final 3 months of half dose phentermine. She did not yet take her Topamax.

## 2018-10-08 ENCOUNTER — Other Ambulatory Visit: Payer: Self-pay | Admitting: Sports Medicine

## 2019-01-04 ENCOUNTER — Other Ambulatory Visit: Payer: Self-pay | Admitting: Sports Medicine

## 2019-01-04 DIAGNOSIS — J4530 Mild persistent asthma, uncomplicated: Secondary | ICD-10-CM

## 2019-01-13 ENCOUNTER — Other Ambulatory Visit: Payer: Self-pay | Admitting: *Deleted

## 2019-01-13 MED ORDER — ALBUTEROL SULFATE HFA 108 (90 BASE) MCG/ACT IN AERS: 1.0000 | INHALATION_SPRAY | Freq: Four times a day (QID) | RESPIRATORY_TRACT | 3 refills | Status: DC | PRN

## 2019-01-29 ENCOUNTER — Encounter (INDEPENDENT_AMBULATORY_CARE_PROVIDER_SITE_OTHER): Payer: BLUE CROSS/BLUE SHIELD | Admitting: Sports Medicine

## 2019-01-29 DIAGNOSIS — E669 Obesity, unspecified: Secondary | ICD-10-CM

## 2019-05-30 ENCOUNTER — Encounter: Payer: Self-pay | Admitting: Sports Medicine

## 2019-07-28 ENCOUNTER — Encounter: Payer: Self-pay | Admitting: Sports Medicine

## 2019-07-29 ENCOUNTER — Ambulatory Visit (INDEPENDENT_AMBULATORY_CARE_PROVIDER_SITE_OTHER): Payer: BC Managed Care – PPO | Admitting: Sports Medicine

## 2019-07-29 DIAGNOSIS — R635 Abnormal weight gain: Secondary | ICD-10-CM

## 2019-07-29 MED ORDER — PHENTERMINE HCL 37.5 MG PO TABS: 37.5000 mg | ORAL_TABLET | Freq: Every day | ORAL | 0 refills | Status: DC

## 2019-07-29 NOTE — Assessment & Plan Note (Signed)
Restarting phentermine, the ultimate plan is 16 hours of intermittent fasting for the first month and then 18 hours for the second and third months and indefinitely after that. Phentermine will make it easier. Initially the plan is going to be 3 months of phentermine followed by a down taper.

## 2019-07-29 NOTE — Progress Notes (Signed)
Virtual Visit via WebEx/MyChart   I connected with  [name]  on 07/29/19 via WebEx/MyChart/Doximity Video and verified that I am speaking with the correct person using two identifiers.   I discussed the limitations, risks, security and privacy concerns of performing an evaluation and management service by WebEx/MyChart/Doximity Video, including the higher likelihood of inaccurate diagnosis and treatment, and the availability of in person appointments.  We also discussed the likely need of an additional face to face encounter for complete and high quality delivery of care.  I also discussed with the patient that there may be a patient responsible charge related to this service. The patient expressed understanding and wishes to proceed.  Provider location is either at home or medical facility. Patient location is at their home, different from provider location. People involved in care of the patient during this telehealth encounter were myself, my nurse/medical assistant, and my front office/scheduling team member.  Subjective:    CC: Weight management  HPI: [name] is a pleasant 43 year old female, she has gained weight during the pandemic, she would like to go back on phentermine, she is interested in trying intermittent fasting however the urge to eat is intolerable during the day.  She wonders if phentermine can help block this urge.  I reviewed the past medical history, family history, social history, surgical history, and allergies today and no changes were needed.  Please see the problem list section below in epic for further details.  Past Medical History: Past Medical History:  Diagnosis Date  . Asthma    rare inhaler use  . Headache(784.0)    migraines  . IBS (irritable bowel syndrome)   . Miscarriage 04/2013  . Pregnancy induced hypertension    Hx with both previous pregnancies, resolved after delivery  . SVD (spontaneous vaginal delivery)    x 2  . Uterus didelphus     Past Surgical History: Past Surgical History:  Procedure Laterality Date  . CESAREAN SECTION  08/31/2011   Procedure: CESAREAN SECTION;  Surgeon: Margarette Asal;  Location: [facility];  Service: Gynecology;  Laterality: N/A;  primary  . DILATION AND CURETTAGE OF UTERUS     x 2  . DILATION AND EVACUATION N/A 03/31/2015   Procedure: DILATATION AND EVACUATION WITH ULTRASOUND GUIDANCE;  Surgeon: Molli Posey, MD;  Location: [facility];  Service: Gynecology;  Laterality: N/A;  . HEMORRHOID SURGERY  2008  . WISDOM TOOTH EXTRACTION     Social History: Social History   Socioeconomic History  . Marital status: Married    Spouse name: [name]"  . Number of children: 3  . Years of education: College  . 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 Financial resource strain: Not on file  . Food insecurity    Worry: Not on file    Inability: Not on file  . Transportation needs    Medical: Not on file    Non-medical: Not on file  Tobacco Use  . Smoking status: Never Smoker  . Smokeless tobacco: Never Used  Substance and Sexual Activity  . Alcohol use: Yes    Comment: 1 glass of wine nightly; beer occasionally, none with pregnancy  . Drug use: No  . Sexual activity: Yes    Birth control/protection: None    Comment: approx [redacted] wks gestation  Lifestyle  . Physical activity    Days per week:  Not on file    Minutes per session: Not on file  . Stress: Not on file  Relationships  . Social Herbalist on phone: Not on file    Gets together: Not on file    Attends religious service: Not on file    Active member of club or organization: Not on file    Attends meetings of clubs or organizations: Not on file    Relationship status: Not on file  Other Topics Concern  . Not on file  Social History Narrative   Patient lives at home with family.   Caffeine Use 1 cup daily   Family History: Family  History  Problem Relation Age of Onset  . Hypertension Mother   . Thyroid disease Mother   . Cancer Mother        skin  . Hashimoto's thyroiditis Mother   . Cancer Maternal Grandmother        colon  . Cancer Maternal Grandfather        leaukemia  . Hypotension Neg Hx   . Malignant hyperthermia Neg Hx   . Pseudochol deficiency Neg Hx   . Anesthesia problems Cousin        difficulty waking up from anesthesia as child  . Migraines Mother   . Hypertension Mother   . Thyroid disease Maternal Grandmother   . Thyroid disease Cousin   . Thyroid disease Maternal Aunt   . Bone cancer Paternal Grandmother    Allergies: Allergies  Allergen Reactions  . Sulfa Antibiotics Rash   Medications: See med rec.  Review of Systems: No fevers, chills, night sweats, weight loss, chest pain, or shortness of breath.   Objective:    General: Speaking full sentences, no audible heavy breathing.  Sounds alert and appropriately interactive.  Appears well.  Face symmetric.  Extraocular movements intact.  Pupils equal and round.  No nasal flaring or accessory muscle use visualized.  No other physical exam performed due to the non-physical nature of this visit.  Impression and Recommendations:    Abnormal weight gain Restarting phentermine, the ultimate plan is 16 hours of intermittent fasting for the first month and then 18 hours for the second and third months and indefinitely after that. Phentermine will make it easier. Initially the plan is going to be 3 months of phentermine followed by a down taper.  I discussed the above assessment and treatment plan with the patient. The patient was provided an opportunity to ask questions and all were answered. The patient agreed with the plan and demonstrated an understanding of the instructions.   The patient was advised to call back or seek an in-person evaluation if the symptoms worsen or if the condition fails to improve as anticipated.   I provided 25  minutes of non-face-to-face time during this encounter, 15 minutes of additional time was needed to gather information, review chart, records, communicate/coordinate with staff remotely, troubleshooting the multiple errors that we get every time when trying to do video calls through the electronic medical record, WebEx, and Doximity, restart the encounter multiple times due to instability of the software, as well as complete documentation.   ___________________________________________ Gwen Her. Dianah Field, M.D., ABFM., CAQSM. Primary Care and Sports Medicine [facility]  Adjunct Professor of [place] of [employer] [employer]

## 2019-08-28 ENCOUNTER — Ambulatory Visit (INDEPENDENT_AMBULATORY_CARE_PROVIDER_SITE_OTHER): Payer: BC Managed Care – PPO | Admitting: Sports Medicine

## 2019-08-28 ENCOUNTER — Encounter: Payer: Self-pay | Admitting: Sports Medicine

## 2019-08-28 DIAGNOSIS — R635 Abnormal weight gain: Secondary | ICD-10-CM | POA: Diagnosis not present

## 2019-08-28 MED ORDER — PHENTERMINE HCL 37.5 MG PO TABS: 37.5000 mg | ORAL_TABLET | Freq: Every day | ORAL | 0 refills | Status: DC

## 2019-08-28 NOTE — Progress Notes (Signed)
Virtual Visit via WebEx/MyChart   I connected with  [name]  on 08/28/19 via WebEx/MyChart/Doximity Video and verified that I am speaking with the correct person using two identifiers.   I discussed the limitations, risks, security and privacy concerns of performing an evaluation and management service by WebEx/MyChart/Doximity Video, including the higher likelihood of inaccurate diagnosis and treatment, and the availability of in person appointments.  We also discussed the likely need of an additional face to face encounter for complete and high quality delivery of care.  I also discussed with the patient that there may be a patient responsible charge related to this service. The patient expressed understanding and wishes to proceed.  Provider location is either at home or medical facility. Patient location is at their home, different from provider location. People involved in care of the patient during this telehealth encounter were myself, my nurse/medical assistant, and my front office/scheduling team member.  Subjective:    CC: Weight check  HPI: See below for further details  I reviewed the past medical history, family history, social history, surgical history, and allergies today and no changes were needed.  Please see the problem list section below in epic for further details.  Past Medical History: Past Medical History:  Diagnosis Date  . Asthma    rare inhaler use  . Headache(784.0)    migraines  . IBS (irritable bowel syndrome)   . Miscarriage 04/2013  . Pregnancy induced hypertension    Hx with both previous pregnancies, resolved after delivery  . SVD (spontaneous vaginal delivery)    x 2  . Uterus didelphus    Past Surgical History: Past Surgical History:  Procedure Laterality Date  . CESAREAN SECTION  08/31/2011   Procedure: CESAREAN SECTION;  Surgeon: Margarette Asal;  Location: [facility];  Service: Gynecology;  Laterality: N/A;  primary  . DILATION AND  CURETTAGE OF UTERUS     x 2  . DILATION AND EVACUATION N/A 03/31/2015   Procedure: DILATATION AND EVACUATION WITH ULTRASOUND GUIDANCE;  Surgeon: Molli Posey, MD;  Location: [facility];  Service: Gynecology;  Laterality: N/A;  . HEMORRHOID SURGERY  2008  . WISDOM TOOTH EXTRACTION     Social History: Social History   Socioeconomic History  . Marital status: Married    Spouse name: Juanito Doom"  . Number of children: 3  . Years of education: College  . 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 Financial resource strain: Not on file  . Food insecurity    Worry: Not on file    Inability: Not on file  . Transportation needs    Medical: Not on file    Non-medical: Not on file  Tobacco Use  . Smoking status: Never Smoker  . Smokeless tobacco: Never Used  Substance and Sexual Activity  . Alcohol use: Yes    Comment: 1 glass of wine nightly; beer occasionally, none with pregnancy  . Drug use: No  . Sexual activity: Yes    Birth control/protection: None    Comment: approx [redacted] wks gestation  Lifestyle  . Physical activity    Days per week: Not on file    Minutes per session: Not on file  . Stress: Not on file  Relationships  . Social Herbalist on phone: Not on file    Gets together: Not on file  Attends religious service: Not on file    Active member of club or organization: Not on file    Attends meetings of clubs or organizations: Not on file    Relationship status: Not on file  Other Topics Concern  . Not on file  Social History Narrative   Patient lives at home with family.   Caffeine Use 1 cup daily   Family History: Family History  Problem Relation Age of Onset  . Hypertension Mother   . Thyroid disease Mother   . Cancer Mother        skin  . Hashimoto's thyroiditis Mother   . Cancer Maternal Grandmother        colon  . Cancer Maternal Grandfather        leaukemia  .  Hypotension Neg Hx   . Malignant hyperthermia Neg Hx   . Pseudochol deficiency Neg Hx   . Anesthesia problems Cousin        difficulty waking up from anesthesia as child  . Migraines Mother   . Hypertension Mother   . Thyroid disease Maternal Grandmother   . Thyroid disease Cousin   . Thyroid disease Maternal Aunt   . Bone cancer Paternal Grandmother    Allergies: Allergies  Allergen Reactions  . Sulfa Antibiotics Rash   Medications: See med rec.  Review of Systems: No fevers, chills, night sweats, weight loss, chest pain, or shortness of breath.   Objective:    General: Speaking full sentences, no audible heavy breathing.  Sounds alert and appropriately interactive.  Appears well.  Face symmetric.  Extraocular movements intact.  Pupils equal and round.  No nasal flaring or accessory muscle use visualized.  No other physical exam performed due to the non-physical nature of this visit.  Impression and Recommendations:    No problem-specific Assessment & Plan notes found for this encounter.   I discussed the above assessment and treatment plan with the patient. The patient was provided an opportunity to ask questions and all were answered. The patient agreed with the plan and demonstrated an understanding of the instructions.   The patient was advised to call back or seek an in-person evaluation if the symptoms worsen or if the condition fails to improve as anticipated.   I provided 25 minutes of non-face-to-face time during this encounter, 15 minutes of additional time was needed to gather information, review chart, records, communicate/coordinate with staff remotely, troubleshooting the multiple errors that we get every time when trying to do video calls through the electronic medical record, WebEx, and Doximity, restart the encounter multiple times due to instability of the software, as well as complete documentation.   ___________________________________________ Gwen Her.  Dianah Field, M.D., ABFM., CAQSM. Primary Care and Sports Medicine [facility]  Adjunct Professor of [place] of [facility] [employer]

## 2019-08-28 NOTE — Assessment & Plan Note (Signed)
Good continued weight loss after the first month of phentermine. She is doing 16 to 18 hours of intermittent fasting and will incorporate an exercise prescription. The plan is for 3 full months of phentermine, entering the second month. Follow-up in 1 month for this.

## 2019-08-29 ENCOUNTER — Telehealth: Payer: Self-pay | Admitting: Sports Medicine

## 2019-08-29 NOTE — Telephone Encounter (Signed)
Received fax for prior authorization on Phentermine sent through cover my meds and recevied Authorization.  CaseId: [id number] Valid 07/30/19 - 11/27/19  I will notify pharmacy - CF

## 2019-09-16 ENCOUNTER — Other Ambulatory Visit: Payer: Self-pay

## 2019-09-16 ENCOUNTER — Encounter (HOSPITAL_COMMUNITY): Payer: Self-pay

## 2019-09-16 ENCOUNTER — Emergency Department (HOSPITAL_COMMUNITY): Payer: BC Managed Care – PPO

## 2019-09-16 ENCOUNTER — Observation Stay (HOSPITAL_COMMUNITY)
Admission: EM | Admit: 2019-09-16 | Discharge: 2019-09-17 | Disposition: A | Discharge status: Home / Self Care | Payer: BC Managed Care – PPO | Attending: Internal Medicine | Admitting: Internal Medicine

## 2019-09-16 DIAGNOSIS — Z820 Family history of epilepsy and other diseases of the nervous system: Secondary | ICD-10-CM | POA: Insufficient documentation

## 2019-09-16 DIAGNOSIS — Z79899 Other long term (current) drug therapy: Secondary | ICD-10-CM | POA: Insufficient documentation

## 2019-09-16 DIAGNOSIS — E872 Acidosis, unspecified: Secondary | ICD-10-CM

## 2019-09-16 DIAGNOSIS — K828 Other specified diseases of gallbladder: Secondary | ICD-10-CM | POA: Insufficient documentation

## 2019-09-16 DIAGNOSIS — J45901 Unspecified asthma with (acute) exacerbation: Secondary | ICD-10-CM | POA: Diagnosis present

## 2019-09-16 DIAGNOSIS — E874 Mixed disorder of acid-base balance: Secondary | ICD-10-CM | POA: Diagnosis not present

## 2019-09-16 DIAGNOSIS — Z800 Family history of malignant neoplasm of digestive organs: Secondary | ICD-10-CM | POA: Insufficient documentation

## 2019-09-16 DIAGNOSIS — E871 Hypo-osmolality and hyponatremia: Secondary | ICD-10-CM | POA: Diagnosis not present

## 2019-09-16 DIAGNOSIS — E873 Alkalosis: Secondary | ICD-10-CM

## 2019-09-16 DIAGNOSIS — Z791 Long term (current) use of non-steroidal anti-inflammatories (NSAID): Secondary | ICD-10-CM | POA: Insufficient documentation

## 2019-09-16 DIAGNOSIS — Z8249 Family history of ischemic heart disease and other diseases of the circulatory system: Secondary | ICD-10-CM | POA: Insufficient documentation

## 2019-09-16 DIAGNOSIS — I10 Essential (primary) hypertension: Secondary | ICD-10-CM | POA: Insufficient documentation

## 2019-09-16 DIAGNOSIS — Z8349 Family history of other endocrine, nutritional and metabolic diseases: Secondary | ICD-10-CM | POA: Diagnosis not present

## 2019-09-16 DIAGNOSIS — Z7951 Long term (current) use of inhaled steroids: Secondary | ICD-10-CM | POA: Insufficient documentation

## 2019-09-16 DIAGNOSIS — Z882 Allergy status to sulfonamides status: Secondary | ICD-10-CM | POA: Insufficient documentation

## 2019-09-16 DIAGNOSIS — Z808 Family history of malignant neoplasm of other organs or systems: Secondary | ICD-10-CM | POA: Diagnosis not present

## 2019-09-16 DIAGNOSIS — K589 Irritable bowel syndrome without diarrhea: Secondary | ICD-10-CM | POA: Diagnosis not present

## 2019-09-16 DIAGNOSIS — J4531 Mild persistent asthma with (acute) exacerbation: Principal | ICD-10-CM | POA: Insufficient documentation

## 2019-09-16 DIAGNOSIS — G43909 Migraine, unspecified, not intractable, without status migrainosus: Secondary | ICD-10-CM | POA: Diagnosis not present

## 2019-09-16 DIAGNOSIS — Z20828 Contact with and (suspected) exposure to other viral communicable diseases: Secondary | ICD-10-CM | POA: Diagnosis not present

## 2019-09-16 DIAGNOSIS — Z806 Family history of leukemia: Secondary | ICD-10-CM | POA: Insufficient documentation

## 2019-09-16 DIAGNOSIS — R17 Unspecified jaundice: Secondary | ICD-10-CM

## 2019-09-16 LAB — CBC WITH DIFFERENTIAL/PLATELET
Abs Immature Granulocytes: 0.02 10*3/uL (ref 0.00–0.07)
Basophils Absolute: 0 10*3/uL (ref 0.0–0.1)
Basophils Relative: 0 %
Eosinophils Absolute: 0.1 10*3/uL (ref 0.0–0.5)
Eosinophils Relative: 1 %
HCT: 40.7 % (ref 36.0–46.0)
Hemoglobin: 14.1 g/dL (ref 12.0–15.0)
Immature Granulocytes: 0 %
Lymphocytes Relative: 27 %
Lymphs Abs: 2.1 10*3/uL (ref 0.7–4.0)
MCH: 32.7 pg (ref 26.0–34.0)
MCHC: 34.6 g/dL (ref 30.0–36.0)
MCV: 94.4 fL (ref 80.0–100.0)
Monocytes Absolute: 0.7 10*3/uL (ref 0.1–1.0)
Monocytes Relative: 9 %
Neutro Abs: 5 10*3/uL (ref 1.7–7.7)
Neutrophils Relative %: 63 %
Platelets: 287 10*3/uL (ref 150–400)
RBC: 4.31 MIL/uL (ref 3.87–5.11)
RDW: 11.4 % — ABNORMAL LOW (ref 11.5–15.5)
WBC: 7.9 10*3/uL (ref 4.0–10.5)
nRBC: 0 % (ref 0.0–0.2)

## 2019-09-16 LAB — COMPREHENSIVE METABOLIC PANEL
ALT: 40 U/L (ref 0–44)
AST: 49 U/L — ABNORMAL HIGH (ref 15–41)
Albumin: 4.4 g/dL (ref 3.5–5.0)
Alkaline Phosphatase: 27 U/L — ABNORMAL LOW (ref 38–126)
Anion gap: 18 — ABNORMAL HIGH (ref 5–15)
BUN: 5 mg/dL — ABNORMAL LOW (ref 6–20)
CO2: 14 mmol/L — ABNORMAL LOW (ref 22–32)
Calcium: 9.1 mg/dL (ref 8.9–10.3)
Chloride: 98 mmol/L (ref 98–111)
Creatinine, Ser: 1.08 mg/dL — ABNORMAL HIGH (ref 0.44–1.00)
GFR calc Af Amer: >60 mL/min (ref >60)
GFR calc non Af Amer: >60 mL/min (ref >60)
Glucose, Bld: 87 mg/dL (ref 70–99)
Potassium: 5.1 mmol/L (ref 3.5–5.1)
Sodium: 130 mmol/L — ABNORMAL LOW (ref 135–145)
Total Bilirubin: 1.8 mg/dL — ABNORMAL HIGH (ref 0.3–1.2)
Total Protein: 7.6 g/dL (ref 6.5–8.1)

## 2019-09-16 LAB — MAGNESIUM: Magnesium: 1.6 mg/dL — ABNORMAL LOW (ref 1.7–2.4)

## 2019-09-16 LAB — TROPONIN I (HIGH SENSITIVITY): Troponin I (High Sensitivity): 4 ng/L (ref <18)

## 2019-09-16 LAB — D-DIMER, QUANTITATIVE: D-Dimer, Quant: 0.49 ug/mL-FEU (ref 0.00–0.50)

## 2019-09-16 MED ORDER — IPRATROPIUM-ALBUTEROL 0.5-2.5 (3) MG/3ML IN SOLN
3.0000 mL | Freq: Once | RESPIRATORY_TRACT | Status: AC
  Administered 2019-09-17: 3 mL via RESPIRATORY_TRACT
  Filled 2019-09-16: qty 3

## 2019-09-16 MED ORDER — LORAZEPAM 2 MG/ML IJ SOLN
0.5000 mg | Freq: Once | INTRAMUSCULAR | Status: AC
  Administered 2019-09-16: 0.5 mg via INTRAVENOUS
  Filled 2019-09-16: qty 1

## 2019-09-16 MED ORDER — METHYLPREDNISOLONE SODIUM SUCC 125 MG IJ SOLR
125.0000 mg | Freq: Once | INTRAMUSCULAR | Status: AC
  Administered 2019-09-16: 125 mg via INTRAVENOUS
  Filled 2019-09-16: qty 2

## 2019-09-16 NOTE — ED Triage Notes (Signed)
Pt reports SOB since Friday that worsened significantly today. Hx of asthma. She states that she used her nebulizer and albuterol inhaler at home. Pt not able to speak in a complete sentence and is "tripodding".

## 2019-09-16 NOTE — ED Provider Notes (Signed)
[facility] DEPT Provider Note   CSN: [id number] Arrival date & time: 09/16/19  2159     History   Chief Complaint Chief Complaint  Patient presents with  . Shortness of Breath    HPI [name] is a 43 y.o. female with a h/o of asthma, pregnancy induced HTN, migraines, IBS, and uterus didelphus who presents to the emergency department with a chief complaint of shortness of breath.  The patient reports that she has been short of breath since 11/13 and thought she was having an asthma exacerbation.  Her dyspnea suddenly worsened today. She reports that the next day that she developed pleuritic chest pain that has been persistent since onset.  She states that she denies feeling pain on the left side of her chest and into her left shoulder as well as in her bilateral ribs.  Reports that she feels as if she has been yawning frequently to try and take a deep breath.  She had some wheezing earlier in the day, but this is since resolved.  She is also having a nonproductive cough and headache.   She reports that she was using her home nebulizer at approximately 2030, which did somewhat improve her shortness of breath, but states that her bilateral hands and feet "locked up" and she was unable to move them for some time. She notes she was also feeling very anxious about her breathing at that time.  Symptoms in her hands and feet has since resolved.  Other than the nebulizer tonight, she reports that she used her albuterol inhaler earlier in the day.  She is also on montelukast and Allegra daily.  At baseline, she has infrequent asthma exacerbations and only uses her albuterol inhaler as needed.  She denies fever, chills, palpitations, leg swelling, numbness, weakness, visual changes, abdominal pain, nausea, vomiting, diarrhea, loss of sense of taste or smell.  She has no new known COVID-19 contacts, but states that her daughter is currently on a basketball team.    States that she was started on phentermine for weight loss approximately 1 month ago and has lost 10 pounds since she began taking medication.  No recent surgery or immobilization.  No history of blood clots.  No recent long travel. She takes Loestrin OCPS.  States triggers for her asthma include weather changes, wearing a mask, and some noxious smells.      The history is provided by the patient. No language interpreter was used.    Past Medical History:  Diagnosis Date  . Asthma    rare inhaler use  . Headache(784.0)    migraines  . IBS (irritable bowel syndrome)   . Miscarriage 04/2013  . Pregnancy induced hypertension    Hx with both previous pregnancies, resolved after delivery  . SVD (spontaneous vaginal delivery)    x 2  . Uterus didelphus     Patient Active Problem List   Diagnosis Date Noted  . Acute asthma exacerbation 09/17/2019  . Annual physical exam 08/14/2017  . Allergic asthma 08/14/2017  . Abnormal weight gain 08/14/2017  . White coat syndrome with high blood pressure but without hypertension 08/14/2017    Past Surgical History:  Procedure Laterality Date  . CESAREAN SECTION  08/31/2011   Procedure: CESAREAN SECTION;  Surgeon: Margarette Asal;  Location: [facility];  Service: Gynecology;  Laterality: N/A;  primary  . DILATION AND CURETTAGE OF UTERUS     x 2  . DILATION AND EVACUATION N/A 03/31/2015  Procedure: DILATATION AND EVACUATION WITH ULTRASOUND GUIDANCE;  Surgeon: Molli Posey, MD;  Location: [facility];  Service: Gynecology;  Laterality: N/A;  . HEMORRHOID SURGERY  2008  . WISDOM TOOTH EXTRACTION       OB History    Gravida  9   Para  3   Term  3   Preterm  0   AB  5   Living  3     SAB  5   TAB  0   Ectopic  0   Multiple  0   Live Births  3            Home Medications    Prior to Admission medications   Medication Sig Start Date End Date Taking? Authorizing Provider  albuterol (PROVENTIL) (2.5 MG/3ML) 0.083% nebulizer  solution Take 2.5 mg by nebulization every 6 (six) hours as needed for wheezing or shortness of breath.   Yes [provider]  albuterol (VENTOLIN HFA) 108 (90 Base) MCG/ACT inhaler Inhale 1-2 puffs into the lungs every 6 (six) hours as needed for wheezing or shortness of breath. 01/13/19  Yes Silverio Decamp, MD  fexofenadine (ALLEGRA) 180 MG tablet TAKE 1 TABLET BY MOUTH EVERY DAY Patient taking differently: Take 180 mg by mouth daily.  10/08/18  Yes Silverio Decamp, MD  ibuprofen (ADVIL) 200 MG tablet Take 800 mg by mouth every 6 (six) hours as needed for moderate pain.   Yes [provider]  montelukast (SINGULAIR) 10 MG tablet TAKE 1 TABLET BY MOUTH EVERYDAY AT BEDTIME Patient taking differently: Take 10 mg by mouth at bedtime.  01/04/19  Yes Silverio Decamp, MD  phentermine (ADIPEX-P) 37.5 MG tablet Take 1 tablet (37.5 mg total) by mouth daily before breakfast. 08/28/19  Yes Silverio Decamp, MD    Family History Family History  Problem Relation Age of Onset  . Hypertension Mother   . Thyroid disease Mother   . Cancer Mother        skin  . Hashimoto's thyroiditis Mother   . Cancer Maternal Grandmother        colon  . Cancer Maternal Grandfather        leaukemia  . Hypotension Neg Hx   . Malignant hyperthermia Neg Hx   . Pseudochol deficiency Neg Hx   . Anesthesia problems Cousin        difficulty waking up from anesthesia as child  . Migraines Mother   . Hypertension Mother   . Thyroid disease Maternal Grandmother   . Thyroid disease Cousin   . Thyroid disease Maternal Aunt   . Bone cancer Paternal Grandmother     Social History Social History   Tobacco Use  . Smoking status: Never Smoker  . Smokeless tobacco: Never Used  Substance Use Topics  . Alcohol use: Yes    Comment: 1 glass of wine nightly; beer occasionally, none with pregnancy  . Drug use: No     Allergies   Sulfa antibiotics   Review of Systems Review of  Systems  Constitutional: Negative for activity change, chills, diaphoresis and fever.  Respiratory: Positive for cough, chest tightness, shortness of breath and wheezing. Negative for stridor.   Cardiovascular: Negative for chest pain, palpitations and leg swelling.  Gastrointestinal: Negative for abdominal pain, blood in stool, constipation, diarrhea, nausea and vomiting.  Genitourinary: Negative for dysuria.  Musculoskeletal: Positive for arthralgias and myalgias. Negative for back pain.  Skin: Negative for rash.  Allergic/Immunologic: Negative for immunocompromised state.  Neurological: Negative for dizziness, seizures, syncope, weakness, numbness and headaches.  Psychiatric/Behavioral: Negative for confusion.     Physical Exam Updated Vital Signs BP 140/83   Pulse (!) 115   Temp 98.1 F (36.7 C) (Oral)   Resp 14   SpO2 94%   Physical Exam Vitals signs and nursing note reviewed.  Constitutional:      General: She is not in acute distress.    Comments: Anxious appearing. Tripodding. Takes a breath every 2-3 words.   HENT:     Head: Normocephalic.     Mouth/Throat:     Mouth: Mucous membranes are moist.     Pharynx: No oropharyngeal exudate or posterior oropharyngeal erythema.  Eyes:     Extraocular Movements: Extraocular movements intact.     Conjunctiva/sclera: Conjunctivae normal.     Pupils: Pupils are equal, round, and reactive to light.  Neck:     Musculoskeletal: Neck supple.  Cardiovascular:     Rate and Rhythm: Regular rhythm. Tachycardia present.     Pulses: Normal pulses.     Heart sounds: Normal heart sounds. No murmur. No friction rub. No gallop.   Pulmonary:     Effort: Respiratory distress present.     Breath sounds: No stridor. No wheezing, rhonchi or rales.     Comments: Lungs are clear to auscultation bilaterally with decent air movement throughout.  No wheezes, rales, or rhonchi.  No stridor.  Tachypneic. Chest:     Chest wall: No tenderness.   Abdominal:     General: There is no distension.     Palpations: Abdomen is soft. There is no mass.     Tenderness: There is no abdominal tenderness. There is no right CVA tenderness, left CVA tenderness, guarding or rebound.     Hernia: No hernia is present.  Musculoskeletal:     Right lower leg: No edema.  Skin:    General: Skin is warm.     Capillary Refill: Capillary refill takes less than 2 seconds.     Findings: No rash.  Neurological:     General: No focal deficit present.     Mental Status: She is alert.  Psychiatric:        Behavior: Behavior normal.      ED Treatments / Results  Labs (all labs ordered are listed, but only abnormal results are displayed) Labs Reviewed  CBC WITH DIFFERENTIAL/PLATELET - Abnormal; Notable for the following components:      Result Value   RDW 11.4 (*)    All other components within normal limits  COMPREHENSIVE METABOLIC PANEL - Abnormal; Notable for the following components:   Sodium 130 (*)    CO2 14 (*)    BUN 5 (*)    Creatinine, Ser 1.08 (*)    AST 49 (*)    Alkaline Phosphatase 27 (*)    Total Bilirubin 1.8 (*)    Anion gap 18 (*)    All other components within normal limits  MAGNESIUM - Abnormal; Notable for the following components:   Magnesium 1.6 (*)    All other components within normal limits  BLOOD GAS, VENOUS - Abnormal; Notable for the following components:   pH, Ven 7.565 (*)    pO2, Ven 75.3 (*)    All other components within normal limits  SARS CORONAVIRUS 2 (TAT 6-24 HRS)  D-DIMER, QUANTITATIVE (NOT AT [facility])  I-STAT BETA HCG BLOOD, ED ([facility], [facility], [facility] ONLY)  TROPONIN I (HIGH SENSITIVITY)  TROPONIN I (HIGH SENSITIVITY)  EKG EKG Interpretation  Date/Time:  Tuesday September 16 2019 23:07:44 EST Ventricular Rate:  106 PR Interval:    QRS Duration: 97 QT Interval:  372 QTC Calculation: 494 R Axis:   88 Text Interpretation: Sinus tachycardia Right atrial enlargement Probable left ventricular hypertrophy ST  depr, consider ischemia, inferior leads Borderline prolonged QT interval Baseline wander in lead(s) V2 Confirmed by Quintella Reichert [phone]) on 09/16/2019 11:11:21 PM   Radiology Dg Chest Portable 1 View  Result Date: 09/16/2019 CLINICAL DATA:  Shortness of breath, cough EXAM: PORTABLE CHEST 1 VIEW COMPARISON:  07/30/2014 FINDINGS: Heart and mediastinal contours are within normal limits. No focal opacities or effusions. No acute bony abnormality. IMPRESSION: No active disease. Electronically Signed   By: Rolm Baptise M.D.   On: 09/16/2019 23:08    Procedures Procedures (including critical care time)  Medications Ordered in ED Medications  ipratropium-albuterol (DUONEB) 0.5-2.5 (3) MG/3ML nebulizer solution 3 mL (3 mLs Nebulization Given 09/17/19 0039)  methylPREDNISolone sodium succinate (SOLU-MEDROL) 125 mg/2 mL injection 125 mg (125 mg Intravenous Given 09/16/19 2316)  LORazepam (ATIVAN) injection 0.5 mg (0.5 mg Intravenous Given 09/16/19 2317)  sodium chloride 0.9 % bolus 1,000 mL (0 mLs Intravenous Stopped 09/17/19 0213)  magnesium sulfate IVPB 2 g 50 mL (0 g Intravenous Stopped 09/17/19 0213)  albuterol (PROVENTIL) (2.5 MG/3ML) 0.083% nebulizer solution 5 mg (5 mg Nebulization Given 09/17/19 0331)     Initial Impression / Assessment and Plan / ED Course  I have reviewed the triage vital signs and the nursing notes.  Pertinent labs & imaging results that were available during my care of the patient were reviewed by me and considered in my medical decision making (see chart for details).        43 year old female with a h/o of asthma, pregnancy induced HTN, migraines, IBS, and uterus didelphus presenting with persistent, worsening shortness of breath over the last 5 days, intermittent wheezing, nonproductive cough, and pleuritic chest pain.  On initial exam, heart rate is in the 120s and she is tachypneic and hypertensive with blood pressure in the 180/100s.  She last used her  nebulizer approximately 3 hours prior to arrival when she had an episode where her hands and feet "locked" up and she felt she was unable to move them. This has since resolved.   On exam, she is tripoding and appears in distress.  She is able to speak 2-3 words before needing to take of breath.  However, lungs are clear to auscultation bilaterally and she has good air movement throughout.  The patient was seen and evaluated by Dr. Ralene Bathe, attending physician, shortly after my initial evaluation with the patient.   Will order DuoNeb and Solu-Medrol.  However, clinical presentation is not classic for asthma exacerbation and the patient's asthma is typically very well controlled.  Given her concerns for "locking up" of her hands and feet, will check electrolytes.  Chest pain is pleuritic and she is tachycardic, which could be secondary to albuterol use, but will add on a D-dimer since she is not PERC negative given her heart rate and history of estrogen containing OCPs.  Dr. Ralene Bathe also recommends a small dose of Ativan as the patient is very anxious appearing.  Labs are notable for a respiratory alkalosis with a pH of 7.56 and bicarb of 14 likely from hyperventilating.  Chest x-ray is unremarkable.  She had hypomagnesia (1.6), which was repleated in the ER. She is mildly hyponatremic at 130, treated with IV fluids.  She  also received Solu-Medrol and DuoNeb.  On reevaluation, the patient reports that she was able to walk to the bathroom and hold onto her IV pole.  She is no longer having any accessory muscle use or tripoding.  She states that she feels much better.  We will ambulate the patient on pulse ox.   03:05- I ambulated the patient on pulse ox down the hallway with the nurse.  Intact the patient several minutes to make it from her room to the end of the hallway and back.  She was holding onto the railing and walking very slow and taking deep breaths with accessory muscle use and endorsing dizziness. At  the end of the hall, she had to stop and catch her breath and pulse ox was hovering between 85 and 90%.  Upon returning to the room, she was very tachycardic in the 120s, having previously been around 100-105 prior to ambulating.  She appeared very air hungry with increased work of breathing.   Given her clinical appearance, I think that she would merit from continued observation.  The patient is in agreement with this plan.  COVID-19 test is pending.  Consult to the hospitalist team and spoke with Dr. Marlowe Sax who will accept the patient for admission.   Final Clinical Impressions(s) / ED Diagnoses   Final diagnoses:  Mild persistent asthma with acute exacerbation  Acute respiratory alkalosis    ED Discharge Orders    None       Joanne Gavel, PA-C 09/17/19 0512    Quintella Reichert, MD 09/17/19 [phone]

## 2019-09-16 NOTE — ED Notes (Signed)
Portable Xray at bedside.

## 2019-09-17 ENCOUNTER — Encounter (HOSPITAL_COMMUNITY): Payer: Self-pay | Admitting: Internal Medicine

## 2019-09-17 DIAGNOSIS — J4531 Mild persistent asthma with (acute) exacerbation: Secondary | ICD-10-CM

## 2019-09-17 DIAGNOSIS — E872 Acidosis, unspecified: Secondary | ICD-10-CM

## 2019-09-17 DIAGNOSIS — J4521 Mild intermittent asthma with (acute) exacerbation: Secondary | ICD-10-CM

## 2019-09-17 DIAGNOSIS — E871 Hypo-osmolality and hyponatremia: Secondary | ICD-10-CM

## 2019-09-17 DIAGNOSIS — R17 Unspecified jaundice: Secondary | ICD-10-CM

## 2019-09-17 DIAGNOSIS — E873 Alkalosis: Secondary | ICD-10-CM

## 2019-09-17 DIAGNOSIS — J45901 Unspecified asthma with (acute) exacerbation: Secondary | ICD-10-CM | POA: Diagnosis present

## 2019-09-17 LAB — BASIC METABOLIC PANEL
Anion gap: 16 — ABNORMAL HIGH (ref 5–15)
BUN: 7 mg/dL (ref 6–20)
CO2: 14 mmol/L — ABNORMAL LOW (ref 22–32)
Calcium: 8.4 mg/dL — ABNORMAL LOW (ref 8.9–10.3)
Chloride: 105 mmol/L (ref 98–111)
Creatinine, Ser: 0.8 mg/dL (ref 0.44–1.00)
GFR calc Af Amer: >60 mL/min (ref >60)
GFR calc non Af Amer: >60 mL/min (ref >60)
Glucose, Bld: 237 mg/dL — ABNORMAL HIGH (ref 70–99)
Potassium: 3.6 mmol/L (ref 3.5–5.1)
Sodium: 135 mmol/L (ref 135–145)

## 2019-09-17 LAB — BLOOD GAS, VENOUS
O2 Saturation: 96.2 %
Patient temperature: 98.6
pH, Ven: 7.565 — ABNORMAL HIGH (ref 7.250–7.430)
pO2, Ven: 75.3 mmHg — ABNORMAL HIGH (ref 32.0–45.0)

## 2019-09-17 LAB — BILIRUBIN, FRACTIONATED(TOT/DIR/INDIR)
Bilirubin, Direct: 0.2 mg/dL (ref 0.0–0.2)
Indirect Bilirubin: 0.7 mg/dL (ref 0.3–0.9)
Total Bilirubin: 0.9 mg/dL (ref 0.3–1.2)

## 2019-09-17 LAB — TROPONIN I (HIGH SENSITIVITY): Troponin I (High Sensitivity): 6 ng/L (ref <18)

## 2019-09-17 LAB — SARS CORONAVIRUS 2 (TAT 6-24 HRS): SARS Coronavirus 2: NEGATIVE

## 2019-09-17 MED ORDER — IPRATROPIUM BROMIDE 0.02 % IN SOLN: 0.5000 mg | Freq: Four times a day (QID) | RESPIRATORY_TRACT | Status: DC

## 2019-09-17 MED ORDER — ALBUTEROL SULFATE (2.5 MG/3ML) 0.083% IN NEBU
5.0000 mg | INHALATION_SOLUTION | Freq: Once | RESPIRATORY_TRACT | Status: AC
  Administered 2019-09-17: 5 mg via RESPIRATORY_TRACT
  Filled 2019-09-17: qty 6

## 2019-09-17 MED ORDER — ALBUTEROL SULFATE HFA 108 (90 BASE) MCG/ACT IN AERS: 1.0000 | INHALATION_SPRAY | RESPIRATORY_TRACT | Status: DC | PRN

## 2019-09-17 MED ORDER — MAGNESIUM SULFATE 2 GM/50ML IV SOLN
2.0000 g | Freq: Once | INTRAVENOUS | Status: AC
  Administered 2019-09-17: 2 g via INTRAVENOUS
  Filled 2019-09-17: qty 50

## 2019-09-17 MED ORDER — PREDNISONE 20 MG PO TABS
40.0000 mg | ORAL_TABLET | Freq: Every day | ORAL | Status: DC
  Administered 2019-09-17: 40 mg via ORAL
  Filled 2019-09-17: qty 2

## 2019-09-17 MED ORDER — MONTELUKAST SODIUM 10 MG PO TABS: 10.0000 mg | ORAL_TABLET | Freq: Every day | ORAL | Status: DC

## 2019-09-17 MED ORDER — MAGNESIUM SULFATE 50 % IJ SOLN: 2.0000 g | Freq: Once | INTRAMUSCULAR | Status: DC

## 2019-09-17 MED ORDER — LEVALBUTEROL HCL 0.63 MG/3ML IN NEBU: 0.6300 mg | INHALATION_SOLUTION | Freq: Four times a day (QID) | RESPIRATORY_TRACT | Status: DC

## 2019-09-17 MED ORDER — IPRATROPIUM-ALBUTEROL 0.5-2.5 (3) MG/3ML IN SOLN: 3.0000 mL | Freq: Four times a day (QID) | RESPIRATORY_TRACT | Status: DC | PRN

## 2019-09-17 MED ORDER — IPRATROPIUM BROMIDE HFA 17 MCG/ACT IN AERS
4.0000 | INHALATION_SPRAY | Freq: Four times a day (QID) | RESPIRATORY_TRACT | Status: DC
  Filled 2019-09-17: qty 12.9

## 2019-09-17 MED ORDER — ENOXAPARIN SODIUM 40 MG/0.4ML ~~LOC~~ SOLN
40.0000 mg | SUBCUTANEOUS | Status: DC
  Filled 2019-09-17: qty 0.4

## 2019-09-17 MED ORDER — ACETAMINOPHEN 325 MG PO TABS: 650.0000 mg | ORAL_TABLET | Freq: Four times a day (QID) | ORAL | Status: DC | PRN

## 2019-09-17 MED ORDER — LEVALBUTEROL TARTRATE 45 MCG/ACT IN AERO
4.0000 | INHALATION_SPRAY | Freq: Four times a day (QID) | RESPIRATORY_TRACT | Status: DC
  Filled 2019-09-17: qty 15

## 2019-09-17 MED ORDER — LORATADINE 10 MG PO TABS: 10.0000 mg | ORAL_TABLET | Freq: Every day | ORAL | Status: DC

## 2019-09-17 MED ORDER — PREDNISONE 10 MG PO TABS: ORAL_TABLET | ORAL | 0 refills | Status: DC

## 2019-09-17 MED ORDER — SODIUM CHLORIDE 0.9 % IV BOLUS
1000.0000 mL | Freq: Once | INTRAVENOUS | Status: AC
  Administered 2019-09-17: 1000 mL via INTRAVENOUS

## 2019-09-17 MED ORDER — ACETAMINOPHEN 650 MG RE SUPP: 650.0000 mg | Freq: Four times a day (QID) | RECTAL | Status: DC | PRN

## 2019-09-17 MED ORDER — ACETAMINOPHEN 325 MG PO TABS: 650.0000 mg | ORAL_TABLET | Freq: Four times a day (QID) | ORAL | Status: AC | PRN

## 2019-09-17 NOTE — ED Notes (Signed)
Pt requests to contact admitting MD, pt states she feels better and doesn't want to be admitted

## 2019-09-17 NOTE — ED Notes (Signed)
ED TO INPATIENT HANDOFF REPORT  Name/Age/Gender [name] 43 y.o. female  Code Status    Code Status Orders  (From admission, onward)         Start     Ordered   09/17/19 0606  Full code  Continuous     09/17/19 0607        Code Status History    Date Active Date Inactive Code Status Order ID Comments User Context   08/31/2011 2041 09/03/2011 1521 Full Code [id number]  Wendall Stade, RN Inpatient   Advance Care Planning Activity      Home/SNF/Other Home  Chief Complaint asthma; shob  Level of Care/Admitting Diagnosis ED Disposition    ED Disposition Condition [place]: [facility] [id number]  Level of Care: Telemetry [5]  Admit to tele based on following criteria: Other see comments  Comments: Monitor hemodynamics  Covid Evaluation: Person Under Investigation (PUI)  Diagnosis: Acute asthma exacerbation [id number]  Admitting Physician: Shela Leff [id number]  Attending Physician: Shela Leff [id number]  PT Class (Do Not Modify): Observation [104]  PT Acc Code (Do Not Modify): Observation [10022]       Medical History Past Medical History:  Diagnosis Date  . Asthma    rare inhaler use  . Headache(784.0)    migraines  . IBS (irritable bowel syndrome)   . Miscarriage 04/2013  . Pregnancy induced hypertension    Hx with both previous pregnancies, resolved after delivery  . SVD (spontaneous vaginal delivery)    x 2  . Uterus didelphus     Allergies Allergies  Allergen Reactions  . Sulfa Antibiotics Rash    IV Location/Drains/Wounds Patient Lines/Drains/Airways Status   Active Line/Drains/Airways    Name:   Placement date:   Placement time:   Site:   Days:   Peripheral IV 09/16/19 Right Wrist   09/16/19    2316    Wrist   1   Incision 08/31/11 Abdomen Other (Comment)   08/31/11    1708     2939   Incision (Closed) 03/31/15 Vagina Other (Comment)   03/31/15    1338     1631           Labs/Imaging Results for orders placed or performed during the hospital encounter of 09/16/19 (from the past 48 hour(s))  D-dimer, quantitative (not at [facility])     Status: None   Collection Time: 09/16/19 11:12 PM  Result Value Ref Range   D-Dimer, Quant 0.49 0.00 - 0.50 ug/mL-FEU    Comment: (NOTE) At the manufacturer cut-off of 0.50 ug/mL FEU, this assay has been documented to exclude PE with a sensitivity and negative predictive value of 97 to 99%.  At this time, this assay has not been approved by the FDA to exclude DVT/VTE. Results should be correlated with clinical presentation. Performed at [facility], [place] [address] [place], [place] [address]   CBC with Differential     Status: Abnormal   Collection Time: 09/16/19 11:12 PM  Result Value Ref Range   WBC 7.9 4.0 - 10.5 K/uL   RBC 4.31 3.87 - 5.11 MIL/uL   Hemoglobin 14.1 12.0 - 15.0 g/dL   HCT 40.7 36.0 - 46.0 %   MCV 94.4 80.0 - 100.0 fL   MCH 32.7 26.0 - 34.0 pg   MCHC 34.6 30.0 - 36.0 g/dL   RDW 11.4 (L) 11.5 - 15.5 %   Platelets 287 150 - 400  K/uL   nRBC 0.0 0.0 - 0.2 %   Neutrophils Relative % 63 %   Neutro Abs 5.0 1.7 - 7.7 K/uL   Lymphocytes Relative 27 %   Lymphs Abs 2.1 0.7 - 4.0 K/uL   Monocytes Relative 9 %   Monocytes Absolute 0.7 0.1 - 1.0 K/uL   Eosinophils Relative 1 %   Eosinophils Absolute 0.1 0.0 - 0.5 K/uL   Basophils Relative 0 %   Basophils Absolute 0.0 0.0 - 0.1 K/uL   Immature Granulocytes 0 %   Abs Immature Granulocytes 0.02 0.00 - 0.07 K/uL    Comment: Performed at [facility], [place] [address], [place], [place] [address]  Comprehensive metabolic panel     Status: Abnormal   Collection Time: 09/16/19 11:12 PM  Result Value Ref Range   Sodium 130 (L) 135 - 145 mmol/L   Potassium 5.1 3.5 - 5.1 mmol/L   Chloride 98 98 - 111 mmol/L   CO2 14 (L) 22 - 32 mmol/L   Glucose, Bld 87 70 - 99 mg/dL   BUN 5 (L) 6 - 20 mg/dL   Creatinine, Ser 1.08 (H) 0.44  - 1.00 mg/dL   Calcium 9.1 8.9 - 10.3 mg/dL   Total Protein 7.6 6.5 - 8.1 g/dL   Albumin 4.4 3.5 - 5.0 g/dL   AST 49 (H) 15 - 41 U/L   ALT 40 0 - 44 U/L   Alkaline Phosphatase 27 (L) 38 - 126 U/L   Total Bilirubin 1.8 (H) 0.3 - 1.2 mg/dL   GFR calc non Af Amer >60 >60 mL/min   GFR calc Af Amer >60 >60 mL/min   Anion gap 18 (H) 5 - 15    Comment: Performed at [facility], [place] [address] [place], [place] [address]  Troponin I (High Sensitivity)     Status: None   Collection Time: 09/16/19 11:12 PM  Result Value Ref Range   Troponin I (High Sensitivity) 4 <18 ng/L    Comment: (NOTE) Elevated high sensitivity troponin I (hsTnI) values and significant  changes across serial measurements may suggest ACS but many other  chronic and acute conditions are known to elevate hsTnI results.  Refer to the "Links" section for chest pain algorithms and additional  guidance. Performed at [facility], [place] [address], [place], [place] [address]   Magnesium     Status: Abnormal   Collection Time: 09/16/19 11:12 PM  Result Value Ref Range   Magnesium 1.6 (L) 1.7 - 2.4 mg/dL    Comment: Performed at [facility], [place] [address], [place], [place] [address]  Blood gas, venous (at [facility], not at [facility])     Status: Abnormal   Collection Time: 09/17/19 12:36 AM  Result Value Ref Range   pH, Ven 7.565 (H) 7.250 - 7.430   pCO2, Ven BELOW REPORTABLE RANGE 44.0 - 60.0 mmHg    Comment: CRITICAL RESULT CALLED TO, READ BACK BY AND VERIFIED WITH: HOLLY LACIVITA @ [id number] ON 09/17/2019 C VARNER    pO2, Ven 75.3 (H) 32.0 - 45.0 mmHg   O2 Saturation 96.2 %   Patient temperature 98.6     Comment: Performed at [facility], [place] [address] [place], [place] [address]  Troponin I (High Sensitivity)     Status: None   Collection Time: 09/17/19 12:36 AM  Result Value Ref Range   Troponin I (High Sensitivity) 6 <18 ng/L    Comment:  (  NOTE) Elevated high sensitivity troponin I (hsTnI) values and significant  changes across serial measurements may suggest ACS but many other  chronic and acute conditions are known to elevate hsTnI results.  Refer to the "Links" section for chest pain algorithms and additional  guidance. Performed at [facility], [place] [address], [place], [place] [address] [id number]   Basic metabolic panel     Status: Abnormal   Collection Time: 09/17/19  6:47 AM  Result Value Ref Range   Sodium 135 135 - 145 mmol/L   Potassium 3.6 3.5 - 5.1 mmol/L    Comment: DELTA CHECK NOTED   Chloride 105 98 - 111 mmol/L   CO2 14 (L) 22 - 32 mmol/L   Glucose, Bld 237 (H) 70 - 99 mg/dL   BUN 7 6 - 20 mg/dL   Creatinine, Ser 0.80 0.44 - 1.00 mg/dL   Calcium 8.4 (L) 8.9 - 10.3 mg/dL   GFR calc non Af Amer >60 >60 mL/min   GFR calc Af Amer >60 >60 mL/min   Anion gap 16 (H) 5 - 15    Comment: Performed at [facility], [place] [address], [place], [place] [address]  Bilirubin, fractionated(tot/dir/indir)     Status: None   Collection Time: 09/17/19  6:47 AM  Result Value Ref Range   Total Bilirubin 0.9 0.3 - 1.2 mg/dL   Bilirubin, Direct 0.2 0.0 - 0.2 mg/dL   Indirect Bilirubin 0.7 0.3 - 0.9 mg/dL    Comment: Performed at [facility], [place] [address], [place], [place] [address]   Dg Chest Portable 1 View  Result Date: 09/16/2019 CLINICAL DATA:  Shortness of breath, cough EXAM: PORTABLE CHEST 1 VIEW COMPARISON:  07/30/2014 FINDINGS: Heart and mediastinal contours are within normal limits. No focal opacities or effusions. No acute bony abnormality. IMPRESSION: No active disease. Electronically Signed   By: Rolm Baptise M.D.   On: 09/16/2019 23:08    Pending Labs Unresulted Labs (From admission, onward)    Start     Ordered   09/17/19 0606  HIV Antibody (routine testing w rflx)  (HIV Antibody (Routine testing w reflex) panel)  Once,   STAT     09/17/19 0607    09/17/19 0302  SARS CORONAVIRUS 2 (TAT 6-24 HRS) Nasopharyngeal Nasopharyngeal Swab  (Asymptomatic/Tier 2 Patients Labs)  Once,   STAT    Question Answer Comment  Is this test for diagnosis or screening Diagnosis of ill patient   Symptomatic for COVID-19 as defined by CDC Yes   Date of Symptom Onset 09/12/2019   Hospitalized for COVID-19 Yes   Admitted to ICU for COVID-19 No   Previously tested for COVID-19 No   Resident in a congregate (group) care setting No   Employed in healthcare setting No   Pregnant No      09/17/19 0302          Vitals/Pain Today's Vitals   09/17/19 0659 09/17/19 0700 09/17/19 0701 09/17/19 0733  BP:  130/87    Pulse: (!) 104 100 (!) 103   Resp: (!) 26 16 17    Temp:      TempSrc:      SpO2: 99%  98%   PainSc:    0-No pain    Isolation Precautions No active isolations  Medications Medications  loratadine (CLARITIN) tablet 10 mg (has no administration in time range)  montelukast (SINGULAIR) tablet 10 mg (has no administration in time range)  enoxaparin (LOVENOX) injection 40 mg (has  no administration in time range)  acetaminophen (TYLENOL) tablet 650 mg (has no administration in time range)    Or  acetaminophen (TYLENOL) suppository 650 mg (has no administration in time range)  predniSONE (DELTASONE) tablet 40 mg (40 mg Oral Given 09/17/19 0855)  levalbuterol (XOPENEX HFA) inhaler 4 puff (4 puffs Inhalation Not Given 09/17/19 0909)  ipratropium (ATROVENT HFA) inhaler 4 puff (4 puffs Inhalation Not Given 09/17/19 0910)  ipratropium-albuterol (DUONEB) 0.5-2.5 (3) MG/3ML nebulizer solution 3 mL (3 mLs Nebulization Given 09/17/19 0039)  methylPREDNISolone sodium succinate (SOLU-MEDROL) 125 mg/2 mL injection 125 mg (125 mg Intravenous Given 09/16/19 2316)  LORazepam (ATIVAN) injection 0.5 mg (0.5 mg Intravenous Given 09/16/19 2317)  sodium chloride 0.9 % bolus 1,000 mL (0 mLs Intravenous Stopped 09/17/19 0213)  magnesium sulfate IVPB 2 g 50 mL (0 g  Intravenous Stopped 09/17/19 0213)  albuterol (PROVENTIL) (2.5 MG/3ML) 0.083% nebulizer solution 5 mg (5 mg Nebulization Given 09/17/19 0331)    Mobility walks

## 2019-09-17 NOTE — H&P (Signed)
History and Physical    VIA FANSLER [id number] DOB: 1975-11-22 DOA: 09/16/2019  PCP: Silverio Decamp, MD Patient coming from: Home  Chief Complaint: Shortness of breath  HPI: [name] is a 43 y.o. female with medical history significant of mild intermittent asthma, IBS, migraine headaches presenting to the ED with complaints of shortness of breath.  Patient states she has mild asthma for which she uses her albuterol inhaler only occasionally.  However, for the past 4 days she has been having a lot of difficulty breathing.  She is wheezing a little.  States she typically does not wheeze a lot when her asthma flares up.  Her chest and ribs have been feeling sore from trying to take deep breaths.  She is having some dry cough.  Symptoms have been getting progressively worse and last night she could barely breathe.  She used her albuterol nebulizer at home which did not help.  States her asthma is usually triggered by smell from hand sanitizer which she did use before her symptoms started a few days ago.  In addition, cold weather tends to flareup her asthma.  She does not have any other environmental allergies that she is aware of.  Denies history of blood clots.  ED Course: Patient noted to be in significant respiratory distress on arrival.  She was tripoding and using accessory muscles of respiration.  VBG with pH 7.56.  Received albuterol nebulizer, DuoNeb, Ativan, IV magnesium 2 g, IV Solu-Medrol 125 mg, and 1 L normal saline bolus.  Not hypoxic at rest but had increased work of breathing with ambulation and oxygen saturation dropped to 85 to 90%.  Afebrile and no leukocytosis.  Sodium 130.  Bicarb 14, anion gap 18.  D-dimer normal.  High-sensitivity troponin x2 negative.  SARS-CoV-2 test pending.  Chest x-ray showing no active disease.  Review of Systems:  All systems reviewed and apart from history of presenting illness, are negative.  Past Medical History:  Diagnosis Date  .  Asthma    rare inhaler use  . Headache(784.0)    migraines  . IBS (irritable bowel syndrome)   . Miscarriage 04/2013  . Pregnancy induced hypertension    Hx with both previous pregnancies, resolved after delivery  . SVD (spontaneous vaginal delivery)    x 2  . Uterus didelphus     Past Surgical History:  Procedure Laterality Date  . CESAREAN SECTION  08/31/2011   Procedure: CESAREAN SECTION;  Surgeon: Margarette Asal;  Location: [facility];  Service: Gynecology;  Laterality: N/A;  primary  . DILATION AND CURETTAGE OF UTERUS     x 2  . DILATION AND EVACUATION N/A 03/31/2015   Procedure: DILATATION AND EVACUATION WITH ULTRASOUND GUIDANCE;  Surgeon: Molli Posey, MD;  Location: [facility];  Service: Gynecology;  Laterality: N/A;  . HEMORRHOID SURGERY  2008  . WISDOM TOOTH EXTRACTION       reports that she has never smoked. She has never used smokeless tobacco. She reports current alcohol use. She reports that she does not use drugs.  Allergies  Allergen Reactions  . Sulfa Antibiotics Rash    Family History  Problem Relation Age of Onset  . Hypertension Mother   . Thyroid disease Mother   . Cancer Mother        skin  . Hashimoto's thyroiditis Mother   . Migraines Mother   . Cancer Maternal Grandmother        colon  . Thyroid disease Maternal Grandmother   .  Cancer Maternal Grandfather        leaukemia  . Anesthesia problems Cousin        difficulty waking up from anesthesia as child  . Thyroid disease Cousin   . Thyroid disease Maternal Aunt   . Bone cancer Paternal Grandmother   . Hypotension Neg Hx   . Malignant hyperthermia Neg Hx   . Pseudochol deficiency Neg Hx     Prior to Admission medications   Medication Sig Start Date End Date Taking? Authorizing Provider  albuterol (PROVENTIL) (2.5 MG/3ML) 0.083% nebulizer solution Take 2.5 mg by nebulization every 6 (six) hours as needed for wheezing or shortness of breath.   Yes [provider]  albuterol (VENTOLIN  HFA) 108 (90 Base) MCG/ACT inhaler Inhale 1-2 puffs into the lungs every 6 (six) hours as needed for wheezing or shortness of breath. 01/13/19  Yes Silverio Decamp, MD  fexofenadine (ALLEGRA) 180 MG tablet TAKE 1 TABLET BY MOUTH EVERY DAY Patient taking differently: Take 180 mg by mouth daily.  10/08/18  Yes Silverio Decamp, MD  ibuprofen (ADVIL) 200 MG tablet Take 800 mg by mouth every 6 (six) hours as needed for moderate pain.   Yes [provider]  montelukast (SINGULAIR) 10 MG tablet TAKE 1 TABLET BY MOUTH EVERYDAY AT BEDTIME Patient taking differently: Take 10 mg by mouth at bedtime.  01/04/19  Yes Silverio Decamp, MD  phentermine (ADIPEX-P) 37.5 MG tablet Take 1 tablet (37.5 mg total) by mouth daily before breakfast. 08/28/19  Yes Silverio Decamp, MD    Physical Exam: Vitals:   09/17/19 0500 09/17/19 0530 09/17/19 0600 09/17/19 0630  BP: 140/83 133/77 129/70 134/89  Pulse: (!) 115 (!) 112 (!) 107 (!) 105  Resp: 14 (!) 21 (!) 22 13  Temp:      TempSrc:      SpO2: 94% 98% 98% 94%    Physical Exam  Constitutional: She is oriented to person, place, and time. She appears well-developed and well-nourished. No distress.  HENT:  Head: Normocephalic.  Eyes: Right eye exhibits no discharge. Left eye exhibits no discharge.  Neck: Neck supple.  Cardiovascular: Normal rate, regular rhythm and intact distal pulses.  Pulmonary/Chest: Effort normal and breath sounds normal. No respiratory distress. She has no wheezes. She has no rales.  Speaking clearly in full sentences  Abdominal: Soft. Bowel sounds are normal. She exhibits no distension. There is no abdominal tenderness. There is no guarding.  Musculoskeletal:        General: No edema.  Neurological: She is alert and oriented to person, place, and time.  Skin: Skin is warm and dry. She is not diaphoretic.     Labs on Admission: I have personally reviewed following labs and imaging studies  CBC:  Recent Labs  Lab 09/16/19 2312  WBC 7.9  NEUTROABS 5.0  HGB 14.1  HCT 40.7  MCV 94.4  PLT [id number]   Basic Metabolic Panel: Recent Labs  Lab 09/16/19 2312  NA 130*  K 5.1  CL 98  CO2 14*  GLUCOSE 87  BUN 5*  CREATININE 1.08*  CALCIUM 9.1  MG 1.6*   GFR: CrCl cannot be calculated (Unknown ideal weight.). Liver Function Tests: Recent Labs  Lab 09/16/19 2312  AST 49*  ALT 40  ALKPHOS 27*  BILITOT 1.8*  PROT 7.6  ALBUMIN 4.4   No results for input(s): LIPASE, AMYLASE in the last 168 hours. No results for input(s): AMMONIA in the last 168 hours. Coagulation Profile:  No results for input(s): INR, PROTIME in the last 168 hours. Cardiac Enzymes: No results for input(s): CKTOTAL, CKMB, CKMBINDEX, TROPONINI in the last 168 hours. BNP (last 3 results) No results for input(s): PROBNP in the last 8760 hours. HbA1C: No results for input(s): HGBA1C in the last 72 hours. CBG: No results for input(s): GLUCAP in the last 168 hours. Lipid Profile: No results for input(s): CHOL, HDL, LDLCALC, TRIG, CHOLHDL, LDLDIRECT in the last 72 hours. Thyroid Function Tests: No results for input(s): TSH, T4TOTAL, FREET4, T3FREE, THYROIDAB in the last 72 hours. Anemia Panel: No results for input(s): VITAMINB12, FOLATE, FERRITIN, TIBC, IRON, RETICCTPCT in the last 72 hours. Urine analysis:    Component Value Date/Time   COLORURINE YELLOW 03/27/2015 0750   APPEARANCEUR CLEAR 03/27/2015 0750   LABSPEC 1.025 03/27/2015 0750   PHURINE 6.5 03/27/2015 0750   GLUCOSEU NEGATIVE 03/27/2015 0750   HGBUR LARGE (A) 03/27/2015 0750   BILIRUBINUR NEGATIVE 03/27/2015 0750   KETONESUR NEGATIVE 03/27/2015 0750   PROTEINUR NEGATIVE 03/27/2015 0750   UROBILINOGEN 0.2 03/27/2015 0750   NITRITE NEGATIVE 03/27/2015 0750   LEUKOCYTESUR TRACE (A) 03/27/2015 0750    Radiological Exams on Admission: Dg Chest Portable 1 View  Result Date: 09/16/2019 CLINICAL DATA:  Shortness of breath, cough EXAM: PORTABLE  CHEST 1 VIEW COMPARISON:  07/30/2014 FINDINGS: Heart and mediastinal contours are within normal limits. No focal opacities or effusions. No acute bony abnormality. IMPRESSION: No active disease. Electronically Signed   By: Rolm Baptise M.D.   On: 09/16/2019 23:08    EKG: Independently reviewed.  Sinus tachycardia, heart rate 106.  Borderline QT prolongation.  LVH.  Diffuse ST changes likely rate related.  Assessment/Plan Principal Problem:   Acute asthma exacerbation Active Problems:   Hyponatremia   Metabolic acidosis   Total bilirubin, elevated   Acute asthma exacerbation Patient noted to be in significant respiratory distress on arrival to the ED.  She was tripoding and using accessory muscles of respiration.  Her work of breathing significantly improved after receiving albuterol plus ipratropium nebulizer treatments, IV magnesium 2 g, and IV Solu-Medrol 125 mg.  Not hypoxic at rest but had increased work of breathing and ambulation oxygen saturation dropped to 85 to 90%.  Tachycardic after receiving albuterol nebulizer treatments.  Currently work of breathing has significantly improved and lungs clear on exam. -Levalbuterol-ipratropium every 6 hours scheduled -Albuterol nebulizer every 4 hours as needed -Prednisone 40 mg daily -Continue Singulair, Claritin -Continuous pulse ox -Supplemental oxygen as needed to keep oxygen saturation above 94% -Check pulse ox with ambulation in a.m.  Mild hyponatremia Sodium 130.  Patient is not on a diuretic. -Received 1 L normal saline bolus in the ED.  Repeat BMP.  Metabolic acidosis Bicarb 14.  Likely a compensation for respiratory alkalosis/ hyperventilation.  Work of breathing has now significantly improved. -Repeat BMP  Mildly elevated T bili T bili 1.8, remainder of LFTs not significantly elevated. -Check fractionated bilirubin level  HIV screening The patient falls between the ages of 13-64 and should be screened for HIV, therefore HIV  testing ordered.  DVT prophylaxis: Lovenox Code Status: Full code Family Communication: Husband at bedside. Disposition Plan: Anticipate discharge after clinical improvement. Consults called: None Admission status: It is my clinical opinion that referral for OBSERVATION is reasonable and necessary in this patient based on the above information provided. The aforementioned taken together are felt to place the patient at high risk for further clinical deterioration. However it is anticipated that the patient may  be medically stable for discharge from the hospital within 24 to 48 hours.  The medical decision making on this patient was of high complexity and the patient is at high risk for clinical deterioration, therefore this is a level 3 visit.  Shela Leff MD [facility] Pager [phone]  If 7PM-7AM, please contact night-coverage [email or web address] Password [facility]  09/17/2019, 6:38 AM

## 2019-09-17 NOTE — Discharge Summary (Signed)
Physician Discharge Summary  [name] [id number] DOB: [birth year] DOA: 09/16/2019  PCP: Silverio Decamp, MD  Admit date: 09/16/2019 Discharge date: 09/17/2019  Admitted From:home Discharge home Recommendations for Outpatient Follow-up:  1. Follow up with PCP in 1-2 weeks 2. Please obtain BMP/CBC in one week   Home Health:none Equipment/Devices:none  Discharge Condition:stable CODE STATUS:full Diet recommendation: cardiac Brief/Interim Summary: 43 y.o. female with medical history significant of mild intermittent asthma, IBS, migraine headaches presenting to the ED with complaints of shortness of breath.  Patient states she has mild asthma for which she uses her albuterol inhaler only occasionally.  However, for the past 4 days she has been having a lot of difficulty breathing.  She is wheezing a little.  States she typically does not wheeze a lot when her asthma flares up.  Her chest and ribs have been feeling sore from trying to take deep breaths.  She is having some dry cough.  Symptoms have been getting progressively worse and last night she could barely breathe.  She used her albuterol nebulizer at home which did not help.  States her asthma is usually triggered by smell from hand sanitizer which she did use before her symptoms started a few days ago.  In addition, cold weather tends to flareup her asthma.  She does not have any other environmental allergies that she is aware of.  Denies history of blood clots.  ED Course: Patient noted to be in significant respiratory distress on arrival.  She was tripoding and using accessory muscles of respiration.  VBG with pH 7.56.  Received albuterol nebulizer, DuoNeb, Ativan, IV magnesium 2 g, IV Solu-Medrol 125 mg, and 1 L normal saline bolus.  Not hypoxic at rest but had increased work of breathing with ambulation and oxygen saturation dropped to 85 to 90%.  Afebrile and no leukocytosis.  Sodium 130.  Bicarb 14, anion gap 18.   D-dimer normal.  High-sensitivity troponin x2 negative.  SARS-CoV-2 test pending.  Chest x-ray showing no active disease. Discharge Diagnoses:  Principal Problem:   Acute asthma exacerbation Active Problems:   Hyponatremia   Metabolic acidosis   Total bilirubin, elevated  #1 asthma exacerbation-patient admitted with asthma exacerbation, hypoxic at 85 on room air.  She was using her accessory muscles on breathing prior to admission.  She was treated with nebulizer Solu-Medrol and magnesium.  She was not hypoxic at rest but her saturation dropped to 85% on ambulation.  She did very well with IV steroids and she is anxious to go home today.  She does not want to stay another night.  She will be discharged on prednisone taper and she will continue Claritin Singulair Proventil nebulizer and Ventolin inhaler. She will follow-up with her PCP in 1 to 2 weeks or earlier if needed.  This was discussed with the patient.  Estimated body mass index is 29.64 kg/m as calculated from the following:   Height as of this encounter: 5\' 2"  (1.575 m).   Weight as of this encounter: 73.5 kg.  Discharge Instructions   Allergies as of 09/17/2019      Reactions   Sulfa Antibiotics Rash      Medication List    TAKE these medications   acetaminophen 325 MG tablet Commonly known as: TYLENOL Take 2 tablets (650 mg total) by mouth every 6 (six) hours as needed for mild pain (or Fever >/= 101).   albuterol (2.5 MG/3ML) 0.083% nebulizer solution Commonly known as: PROVENTIL Take 2.5 mg by nebulization  every 6 (six) hours as needed for wheezing or shortness of breath.   albuterol 108 (90 Base) MCG/ACT inhaler Commonly known as: Ventolin HFA Inhale 1-2 puffs into the lungs every 6 (six) hours as needed for wheezing or shortness of breath.   fexofenadine 180 MG tablet Commonly known as: ALLEGRA TAKE 1 TABLET BY MOUTH EVERY DAY   ibuprofen 200 MG tablet Commonly known as: ADVIL Take 800 mg by mouth every 6  (six) hours as needed for moderate pain.   montelukast 10 MG tablet Commonly known as: SINGULAIR TAKE 1 TABLET BY MOUTH EVERYDAY AT BEDTIME What changed: See the new instructions.   phentermine 37.5 MG tablet Commonly known as: ADIPEX-P Take 1 tablet (37.5 mg total) by mouth daily before breakfast.   predniSONE 10 MG tablet Commonly known as: DELTASONE Take 3 tablets for 3 days then 2 tablets for 3 days then 1 tablet daily till done.      Follow-up Information    Silverio Decamp, MD Follow up.   Specialties: Family Medicine, Sports Medicine, Radiology Contact information: [id number] [place] [id number] [phone]          Allergies  Allergen Reactions  . Sulfa Antibiotics Rash    Consultations:  None   Procedures/Studies: Dg Chest Portable 1 View  Result Date: 09/16/2019 CLINICAL DATA:  Shortness of breath, cough EXAM: PORTABLE CHEST 1 VIEW COMPARISON:  07/30/2014 FINDINGS: Heart and mediastinal contours are within normal limits. No focal opacities or effusions. No acute bony abnormality. IMPRESSION: No active disease. Electronically Signed   By: Rolm Baptise M.D.   On: 09/16/2019 23:08   (Echo, Carotid, EGD, Colonoscopy, ERCP)    Subjective: Sitting up at the side of the bed anxious to go home feels breathing is back to her baseline walked today without any evidence of hypoxia.  Saturation 98 - 100% on room air with ambulation.  Discharge Exam: Vitals:   09/17/19 0701 09/17/19 0927  BP:  125/87  Pulse: (!) 103 96  Resp: 17 18  Temp:  98.2 F (36.8 C)  SpO2: 98% 100%   Vitals:   09/17/19 0659 09/17/19 0700 09/17/19 0701 09/17/19 0927  BP:  130/87  125/87  Pulse: (!) 104 100 (!) 103 96  Resp: (!) 26 16 17 18   Temp:    98.2 F (36.8 C)  TempSrc:    Oral  SpO2: 99%  98% 100%  Weight:    73.5 kg  Height:    5\' 2"  (1.575 m)    General: Pt is alert, awake, not in acute distress Cardiovascular: RRR, S1/S2 +, no rubs, no  gallops Respiratory: Few scattered wheezes bilaterally, no rhonchi Abdominal: Soft, NT, ND, bowel sounds + Extremities: no edema, no cyanosis    The results of significant diagnostics from this hospitalization (including imaging, microbiology, ancillary and laboratory) are listed below for reference.     Microbiology: No results found for this or any previous visit (from the past 240 hour(s)).   Labs: BNP (last 3 results) No results for input(s): BNP in the last 8760 hours. Basic Metabolic Panel: Recent Labs  Lab 09/16/19 2312 09/17/19 0647  NA 130* 135  K 5.1 3.6  CL 98 105  CO2 14* 14*  GLUCOSE 87 237*  BUN 5* 7  CREATININE 1.08* 0.80  CALCIUM 9.1 8.4*  MG 1.6*  --    Liver Function Tests: Recent Labs  Lab 09/16/19 2312 09/17/19 0647  AST 49*  --  ALT 40  --   ALKPHOS 27*  --   BILITOT 1.8* 0.9  PROT 7.6  --   ALBUMIN 4.4  --    No results for input(s): LIPASE, AMYLASE in the last 168 hours. No results for input(s): AMMONIA in the last 168 hours. CBC: Recent Labs  Lab 09/16/19 2312  WBC 7.9  NEUTROABS 5.0  HGB 14.1  HCT 40.7  MCV 94.4  PLT 287   Cardiac Enzymes: No results for input(s): CKTOTAL, CKMB, CKMBINDEX, TROPONINI in the last 168 hours. BNP: Invalid input(s): POCBNP CBG: No results for input(s): GLUCAP in the last 168 hours. D-Dimer Recent Labs    09/16/19 2312  DDIMER 0.49   Hgb A1c No results for input(s): HGBA1C in the last 72 hours. Lipid Profile No results for input(s): CHOL, HDL, LDLCALC, TRIG, CHOLHDL, LDLDIRECT in the last 72 hours. Thyroid function studies No results for input(s): TSH, T4TOTAL, T3FREE, THYROIDAB in the last 72 hours.  Invalid input(s): FREET3 Anemia work up No results for input(s): VITAMINB12, FOLATE, FERRITIN, TIBC, IRON, RETICCTPCT in the last 72 hours. Urinalysis    Component Value Date/Time   COLORURINE YELLOW 03/27/2015 0750   APPEARANCEUR CLEAR 03/27/2015 0750   LABSPEC 1.025 03/27/2015 0750    PHURINE 6.5 03/27/2015 0750   GLUCOSEU NEGATIVE 03/27/2015 0750   HGBUR LARGE (A) 03/27/2015 0750   BILIRUBINUR NEGATIVE 03/27/2015 0750   KETONESUR NEGATIVE 03/27/2015 0750   PROTEINUR NEGATIVE 03/27/2015 0750   UROBILINOGEN 0.2 03/27/2015 0750   NITRITE NEGATIVE 03/27/2015 0750   LEUKOCYTESUR TRACE (A) 03/27/2015 0750   Sepsis Labs Invalid input(s): PROCALCITONIN,  WBC,  LACTICIDVEN Microbiology No results found for this or any previous visit (from the past 240 hour(s)).   Time coordinating discharge: 26minutes  SIGNED:   Georgette Shell, MD  [facility] 09/17/2019, 11:23 AM Pager   If 7PM-7AM, please contact night-coverage [email or web address] Password [email or web address]

## 2019-09-17 NOTE — ED Notes (Signed)
Date and time results received: 09/17/19 12:56 AM (use smartphrase ".now" to insert current time)  Test: CO2 Critical Value: Below reportable range  Name of Provider Notified: Mia PA  Orders Received? Or Actions Taken?: Orders Received - See Orders for details, Actions Taken:   and

## 2019-09-17 NOTE — Progress Notes (Signed)
Patient ambulated in room on room air.  Oxygen saturations maintained between 98-100% on room air.  Patient denies SOB or trouble breathing.  Patient appears comfortable and stable while ambulating.  Dr. Rodena Piety to round on patient.  Will continue to monitor.

## 2019-09-18 ENCOUNTER — Telehealth: Payer: Self-pay | Admitting: *Deleted

## 2019-09-18 LAB — HIV ANTIBODY (ROUTINE TESTING W REFLEX): HIV Screen 4th Generation wRfx: NONREACTIVE — AB

## 2019-09-18 NOTE — Telephone Encounter (Signed)
That's fine

## 2019-09-18 NOTE — Telephone Encounter (Signed)
LMOM notifying pt.

## 2019-09-18 NOTE — Telephone Encounter (Signed)
Pt left vm stating that she was given prednisone at the ED for an asthma attack and she wanted to know if that was okay to take with her phentermine.  Please advise.

## 2019-09-24 ENCOUNTER — Ambulatory Visit (INDEPENDENT_AMBULATORY_CARE_PROVIDER_SITE_OTHER): Payer: BC Managed Care – PPO | Admitting: Sports Medicine

## 2019-09-24 DIAGNOSIS — J4530 Mild persistent asthma, uncomplicated: Secondary | ICD-10-CM | POA: Diagnosis not present

## 2019-09-24 DIAGNOSIS — R030 Elevated blood-pressure reading, without diagnosis of hypertension: Secondary | ICD-10-CM

## 2019-09-24 MED ORDER — ALBUTEROL SULFATE HFA 108 (90 BASE) MCG/ACT IN AERS: 1.0000 | INHALATION_SPRAY | Freq: Four times a day (QID) | RESPIRATORY_TRACT | 11 refills | Status: DC | PRN

## 2019-09-24 MED ORDER — ALBUTEROL SULFATE (2.5 MG/3ML) 0.083% IN NEBU: 2.5000 mg | INHALATION_SOLUTION | Freq: Four times a day (QID) | RESPIRATORY_TRACT | 11 refills | Status: DC | PRN

## 2019-09-24 MED ORDER — BUDESONIDE-FORMOTEROL FUMARATE 80-4.5 MCG/ACT IN AERO: 1.0000 | INHALATION_SPRAY | Freq: Two times a day (BID) | RESPIRATORY_TRACT | 3 refills | Status: DC

## 2019-09-24 NOTE — Assessment & Plan Note (Signed)
Blood pressure was elevated, we can revisit this at the follow-up visit.

## 2019-09-24 NOTE — Assessment & Plan Note (Signed)
Hospitalization with asthma exacerbation, appropriately treated. She still has a bit of a cough, mild shortness of breath so we are going to add low-dose Symbicort, continue albuterol as needed. Continue Singulair and Allegra. Return to see me in 2 weeks in an in person visit.

## 2019-09-24 NOTE — Progress Notes (Signed)
Virtual Visit via WebEx/MyChart   I connected with  [name]  on 09/24/19 via WebEx/MyChart/Doximity Video and verified that I am speaking with the correct person using two identifiers.   I discussed the limitations, risks, security and privacy concerns of performing an evaluation and management service by WebEx/MyChart/Doximity Video, including the higher likelihood of inaccurate diagnosis and treatment, and the availability of in person appointments.  We also discussed the likely need of an additional face to face encounter for complete and high quality delivery of care.  I also discussed with the patient that there may be a patient responsible charge related to this service. The patient expressed understanding and wishes to proceed.  Provider location is either at home or medical facility. Patient location is at their home, different from provider location. People involved in care of the patient during this telehealth encounter were myself, my nurse/medical assistant, and my front office/scheduling team member.  Subjective:    CC: Follow-up  HPI: [name] had asthma exacerbation in the hospital, she was treated with bronchodilators, steroids, she is here for follow-up see below for further details.  I reviewed the past medical history, family history, social history, surgical history, and allergies today and no changes were needed.  Please see the problem list section below in epic for further details.  Past Medical History: Past Medical History:  Diagnosis Date  . Asthma    rare inhaler use  . Headache(784.0)    migraines  . IBS (irritable bowel syndrome)   . Miscarriage 04/2013  . Pregnancy induced hypertension    Hx with both previous pregnancies, resolved after delivery  . SVD (spontaneous vaginal delivery)    x 2  . Uterus didelphus    Past Surgical History: Past Surgical History:  Procedure Laterality Date  . CESAREAN SECTION  08/31/2011   Procedure: CESAREAN  SECTION;  Surgeon: Margarette Asal;  Location: [facility];  Service: Gynecology;  Laterality: N/A;  primary  . DILATION AND CURETTAGE OF UTERUS     x 2  . DILATION AND EVACUATION N/A 03/31/2015   Procedure: DILATATION AND EVACUATION WITH ULTRASOUND GUIDANCE;  Surgeon: Molli Posey, MD;  Location: [facility];  Service: Gynecology;  Laterality: N/A;  . HEMORRHOID SURGERY  2008  . WISDOM TOOTH EXTRACTION     Social History: Social History   Socioeconomic History  . Marital status: Married    Spouse name: Juanito Doom"  . Number of children: 3  . Years of education: College  . 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 Financial resource strain: Not on file  . Food insecurity    Worry: Not on file    Inability: Not on file  . Transportation needs    Medical: Not on file    Non-medical: Not on file  Tobacco Use  . Smoking status: Never Smoker  . Smokeless tobacco: Never Used  Substance and Sexual Activity  . Alcohol use: Yes    Comment: 1 glass of wine nightly; beer occasionally, none with pregnancy  . Drug use: No  . Sexual activity: Yes    Birth control/protection: None    Comment: approx [redacted] wks gestation  Lifestyle  . Physical activity    Days per week: Not on file    Minutes per session: Not on file  . Stress: Not on file  Relationships  . Social connections  Talks on phone: Not on file    Gets together: Not on file    Attends religious service: Not on file    Active member of club or organization: Not on file    Attends meetings of clubs or organizations: Not on file    Relationship status: Not on file  Other Topics Concern  . Not on file  Social History Narrative   Patient lives at home with family.   Caffeine Use 1 cup daily   Family History: Family History  Problem Relation Age of Onset  . Hypertension Mother   . Thyroid disease Mother   . Cancer Mother        skin  .  Hashimoto's thyroiditis Mother   . Migraines Mother   . Cancer Maternal Grandmother        colon  . Thyroid disease Maternal Grandmother   . Cancer Maternal Grandfather        leaukemia  . Anesthesia problems Cousin        difficulty waking up from anesthesia as child  . Thyroid disease Cousin   . Thyroid disease Maternal Aunt   . Bone cancer Paternal Grandmother   . Hypotension Neg Hx   . Malignant hyperthermia Neg Hx   . Pseudochol deficiency Neg Hx    Allergies: Allergies  Allergen Reactions  . Sulfa Antibiotics Rash   Medications: See med rec.  Review of Systems: No fevers, chills, night sweats, weight loss, chest pain, or shortness of breath.   Objective:    General: Speaking full sentences, no audible heavy breathing.  Sounds alert and appropriately interactive.  Appears well.  Face symmetric.  Extraocular movements intact.  Pupils equal and round.  No nasal flaring or accessory muscle use visualized.  No other physical exam performed due to the non-physical nature of this visit.  Impression and Recommendations:    Allergic asthma Hospitalization with asthma exacerbation, appropriately treated. She still has a bit of a cough, mild shortness of breath so we are going to add low-dose Symbicort, continue albuterol as needed. Continue Singulair and Allegra. Return to see me in 2 weeks in an in person visit.  White coat syndrome with high blood pressure but without hypertension Blood pressure was elevated, we can revisit this at the follow-up visit.   I discussed the above assessment and treatment plan with the patient. The patient was provided an opportunity to ask questions and all were answered. The patient agreed with the plan and demonstrated an understanding of the instructions.   The patient was advised to call back or seek an in-person evaluation if the symptoms worsen or if the condition fails to improve as anticipated.   I provided 25 minutes of  non-face-to-face time during this encounter, 15 minutes of additional time was needed to gather information, review chart, records, communicate/coordinate with staff remotely, troubleshooting the multiple errors that we get every time when trying to do video calls through the electronic medical record, WebEx, and Doximity, restart the encounter multiple times due to instability of the software, as well as complete documentation.   ___________________________________________ Gwen Her. Dianah Field, M.D., ABFM., CAQSM. Primary Care and Sports Medicine [facility]  Adjunct Professor of [place] [employer] [facility] of Medicine

## 2019-10-08 ENCOUNTER — Encounter: Payer: Self-pay | Admitting: Sports Medicine

## 2019-10-08 ENCOUNTER — Ambulatory Visit (INDEPENDENT_AMBULATORY_CARE_PROVIDER_SITE_OTHER): Payer: BC Managed Care – PPO | Admitting: Sports Medicine

## 2019-10-08 ENCOUNTER — Ambulatory Visit (INDEPENDENT_AMBULATORY_CARE_PROVIDER_SITE_OTHER): Payer: BC Managed Care – PPO

## 2019-10-08 ENCOUNTER — Other Ambulatory Visit: Payer: Self-pay

## 2019-10-08 DIAGNOSIS — J4530 Mild persistent asthma, uncomplicated: Secondary | ICD-10-CM | POA: Diagnosis not present

## 2019-10-08 DIAGNOSIS — R635 Abnormal weight gain: Secondary | ICD-10-CM

## 2019-10-08 DIAGNOSIS — M5030 Other cervical disc degeneration, unspecified cervical region: Secondary | ICD-10-CM

## 2019-10-08 DIAGNOSIS — R030 Elevated blood-pressure reading, without diagnosis of hypertension: Secondary | ICD-10-CM

## 2019-10-08 MED ORDER — PHENTERMINE HCL 37.5 MG PO TABS: 37.5000 mg | ORAL_TABLET | Freq: Every day | ORAL | 0 refills | Status: AC

## 2019-10-08 NOTE — Assessment & Plan Note (Signed)
Restarting weight loss treatment. We are technically entering the third month now. She will also do 16 to 18 hours of intermittent fasting. I have already given her an exercise prescription. Virtual visit in 1 month for a weight check, she will have blood pressure, weight, and pulse ready.

## 2019-10-08 NOTE — Assessment & Plan Note (Signed)
Improved considerably, we can start weight loss treatment now.

## 2019-10-08 NOTE — Progress Notes (Signed)
Subjective:    CC: Follow-up  HPI: [name] is a pleasant 43 year old female, she had some worsening of asthma recently and ended up in the hospital, she left AMA, we added Symbicort at the last visit and she returns today symptom-free.  Neck pain: Localized left periscapular region radiating from the C7 spinous process, moderate, persistent, nothing overtly radicular, no progressive weakness, no constitutional symptoms, no trauma.  Overweight: Would like to restart phentermine.  I reviewed the past medical history, family history, social history, surgical history, and allergies today and no changes were needed.  Please see the problem list section below in epic for further details.  Past Medical History: Past Medical History:  Diagnosis Date  . Asthma    rare inhaler use  . Headache(784.0)    migraines  . IBS (irritable bowel syndrome)   . Miscarriage 04/2013  . Pregnancy induced hypertension    Hx with both previous pregnancies, resolved after delivery  . SVD (spontaneous vaginal delivery)    x 2  . Uterus didelphus    Past Surgical History: Past Surgical History:  Procedure Laterality Date  . CESAREAN SECTION  08/31/2011   Procedure: CESAREAN SECTION;  Surgeon: Margarette Asal;  Location: [facility];  Service: Gynecology;  Laterality: N/A;  primary  . DILATION AND CURETTAGE OF UTERUS     x 2  . DILATION AND EVACUATION N/A 03/31/2015   Procedure: DILATATION AND EVACUATION WITH ULTRASOUND GUIDANCE;  Surgeon: Molli Posey, MD;  Location: [facility];  Service: Gynecology;  Laterality: N/A;  . HEMORRHOID SURGERY  2008  . WISDOM TOOTH EXTRACTION     Social History: Social History   Socioeconomic History  . Marital status: Married    Spouse name: Juanito Doom"  . Number of children: 3  . Years of education: College  . 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 Financial resource strain: Not on file   . Food insecurity    Worry: Not on file    Inability: Not on file  . Transportation needs    Medical: Not on file    Non-medical: Not on file  Tobacco Use  . Smoking status: Never Smoker  . Smokeless tobacco: Never Used  Substance and Sexual Activity  . Alcohol use: Yes    Comment: 1 glass of wine nightly; beer occasionally, none with pregnancy  . Drug use: No  . Sexual activity: Yes    Birth control/protection: None    Comment: approx [redacted] wks gestation  Lifestyle  . Physical activity    Days per week: Not on file    Minutes per session: Not on file  . Stress: Not on file  Relationships  . Social Herbalist on phone: Not on file    Gets together: Not on file    Attends religious service: Not on file    Active member of club or organization: Not on file    Attends meetings of clubs or organizations: Not on file    Relationship status: Not on file  Other Topics Concern  . Not on file  Social History Narrative   Patient lives at home with family.   Caffeine Use 1 cup daily   Family History: Family History  Problem Relation Age of Onset  . Hypertension Mother   . Thyroid disease Mother   . Cancer Mother  skin  . Hashimoto's thyroiditis Mother   . Migraines Mother   . Cancer Maternal Grandmother        colon  . Thyroid disease Maternal Grandmother   . Cancer Maternal Grandfather        leaukemia  . Anesthesia problems Cousin        difficulty waking up from anesthesia as child  . Thyroid disease Cousin   . Thyroid disease Maternal Aunt   . Bone cancer Paternal Grandmother   . Hypotension Neg Hx   . Malignant hyperthermia Neg Hx   . Pseudochol deficiency Neg Hx    Allergies: Allergies  Allergen Reactions  . Sulfa Antibiotics Rash   Medications: See med rec.  Review of Systems: No fevers, chills, night sweats, weight loss, chest pain, or shortness of breath.   Objective:    General: Well Developed, well nourished, and in no acute distress.   Neuro: Alert and oriented x3, extra-ocular muscles intact, sensation grossly intact.  HEENT: Normocephalic, atraumatic, pupils equal round reactive to light, neck supple, no masses, no lymphadenopathy, thyroid nonpalpable.  Skin: Warm and dry, no rashes. Cardiac: Regular rate and rhythm, no murmurs rubs or gallops, no lower extremity edema.  Respiratory: Clear to auscultation bilaterally. Not using accessory muscles, speaking in full sentences.  Impression and Recommendations:    White coat syndrome with high blood pressure but without hypertension Improved considerably, we can start weight loss treatment now.  Allergic asthma Now well controlled with Symbicort, occasional albuterol, Singulair and Allegra. Return as needed for this.  DDD (degenerative disc disease), cervical X-rays, cervical spine rehab, return to see me if no better in 4 to 6 weeks.  Abnormal weight gain Restarting weight loss treatment. We are technically entering the third month now. She will also do 16 to 18 hours of intermittent fasting. I have already given her an exercise prescription. Virtual visit in 1 month for a weight check, she will have blood pressure, weight, and pulse ready.   ___________________________________________ Gwen Her. Dianah Field, M.D., ABFM., CAQSM. Primary Care and Sports Medicine  [facility]  Adjunct Professor of [place] [employer] [facility] of Medicine

## 2019-10-08 NOTE — Assessment & Plan Note (Signed)
Now well controlled with Symbicort, occasional albuterol, Singulair and Allegra. Return as needed for this.

## 2019-10-08 NOTE — Assessment & Plan Note (Signed)
X-rays, cervical spine rehab, return to see me if no better in 4 to 6 weeks.

## 2019-10-14 ENCOUNTER — Other Ambulatory Visit: Payer: Self-pay | Admitting: Sports Medicine

## 2019-10-14 MED ORDER — FEXOFENADINE HCL 180 MG PO TABS: 180.0000 mg | ORAL_TABLET | Freq: Every day | ORAL | 3 refills | Status: AC

## 2019-11-05 ENCOUNTER — Ambulatory Visit: Payer: BC Managed Care – PPO | Admitting: Sports Medicine

## 2020-04-03 IMAGING — DX DG CERVICAL SPINE COMPLETE 4+V
5 series · 5 of 5 positions shown · non-contrast
Comparison: None.

CLINICAL DATA: Neck pain for 6 months. No specific injury.

EXAM:
CERVICAL SPINE - COMPLETE 4+ VIEW

[c-spine lat]
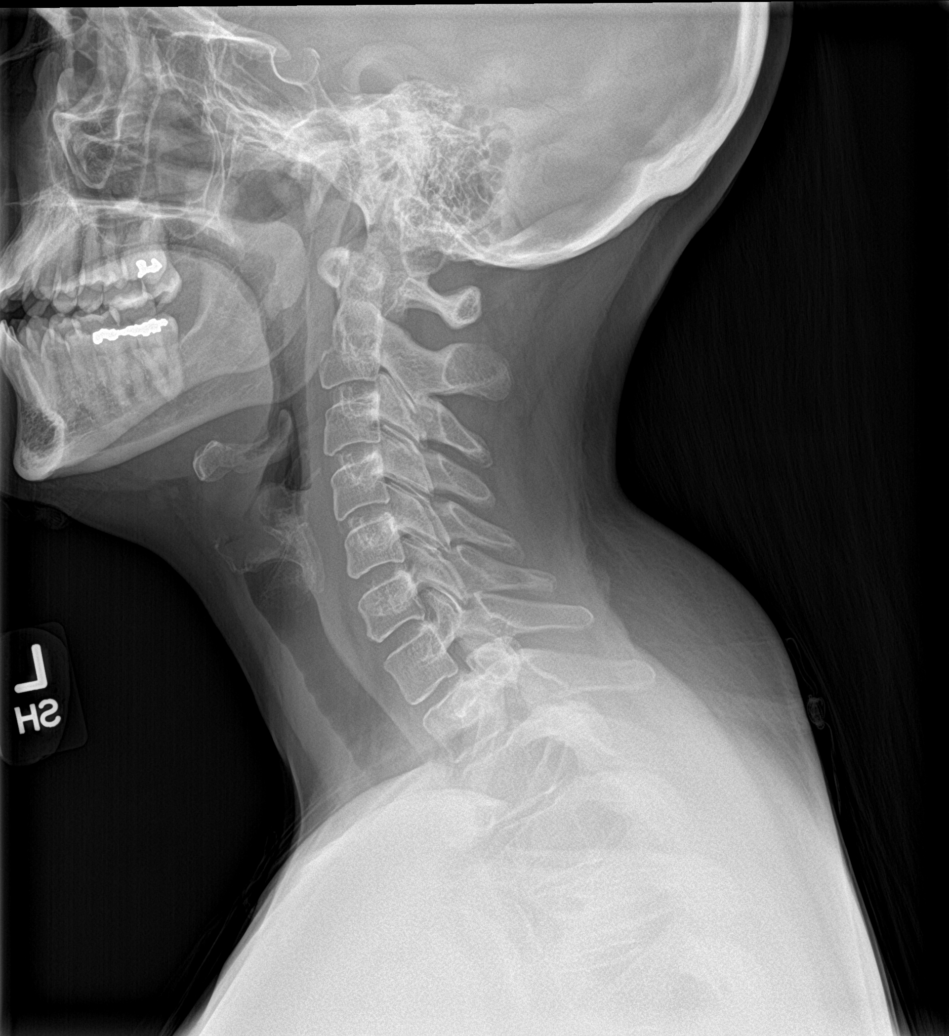

[c-spine obl (1 of 2)]
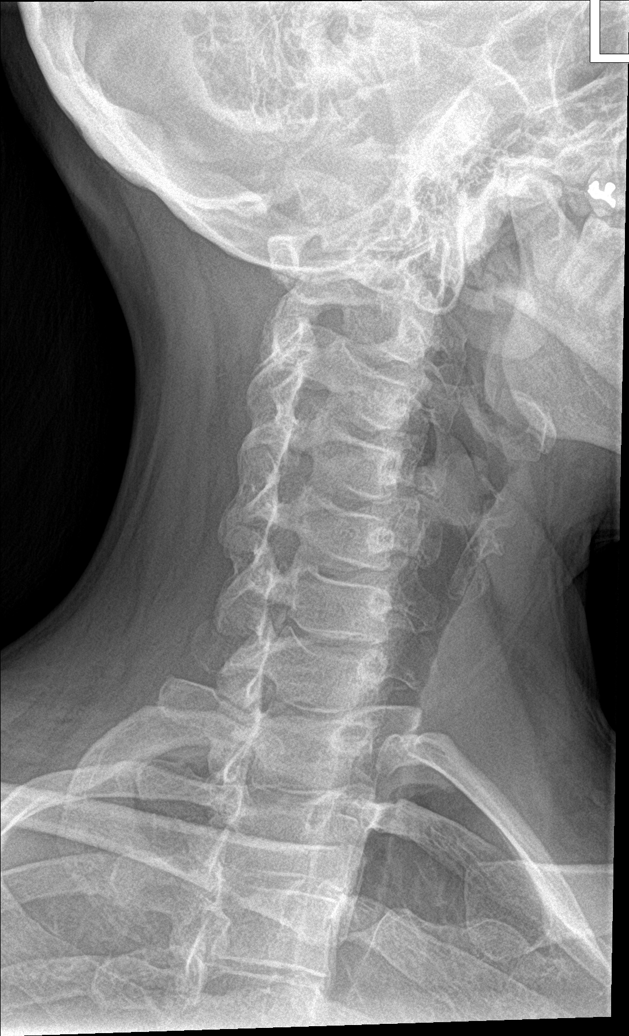

[c-spine obl (2 of 2)]
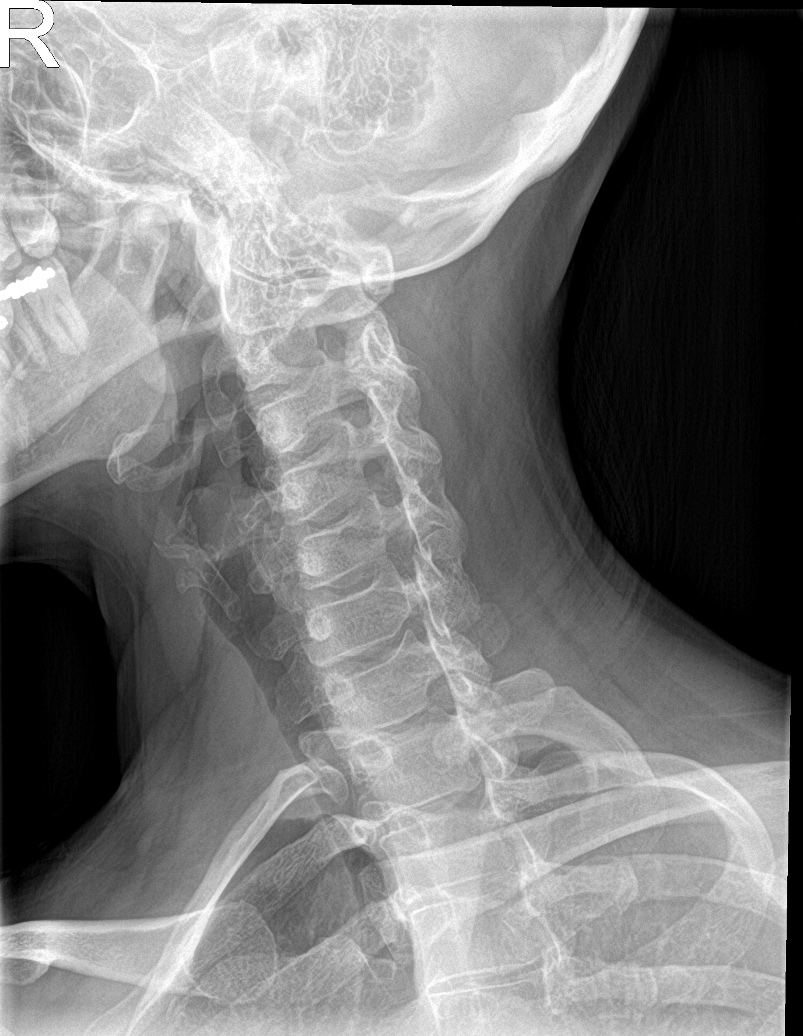

[c-spine ap]
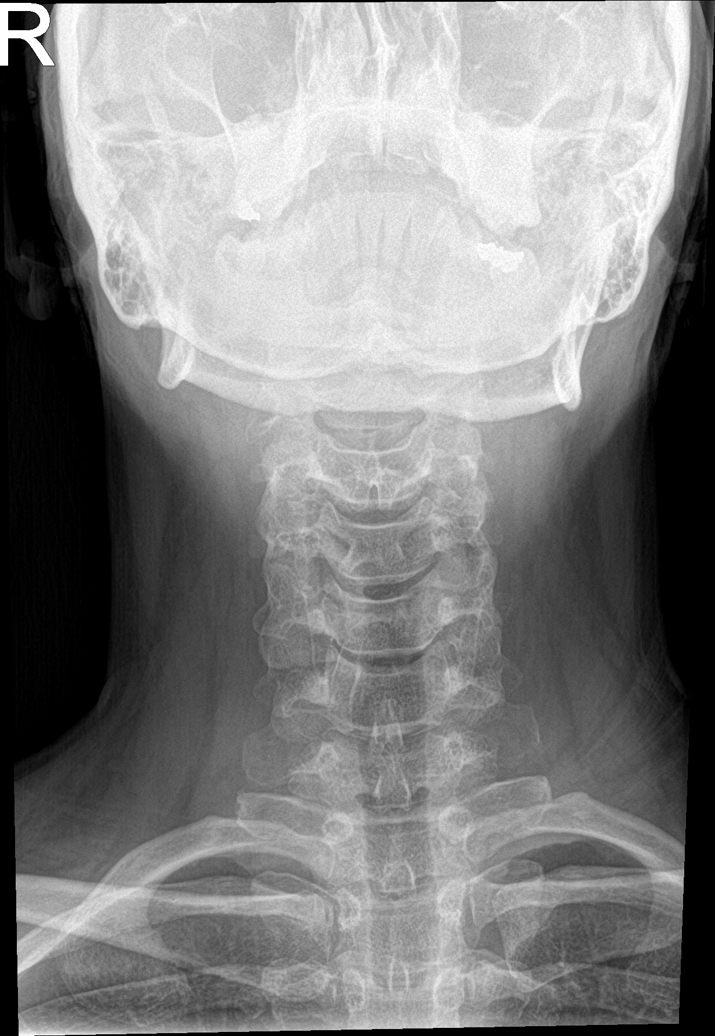

[c-spine open mouth]
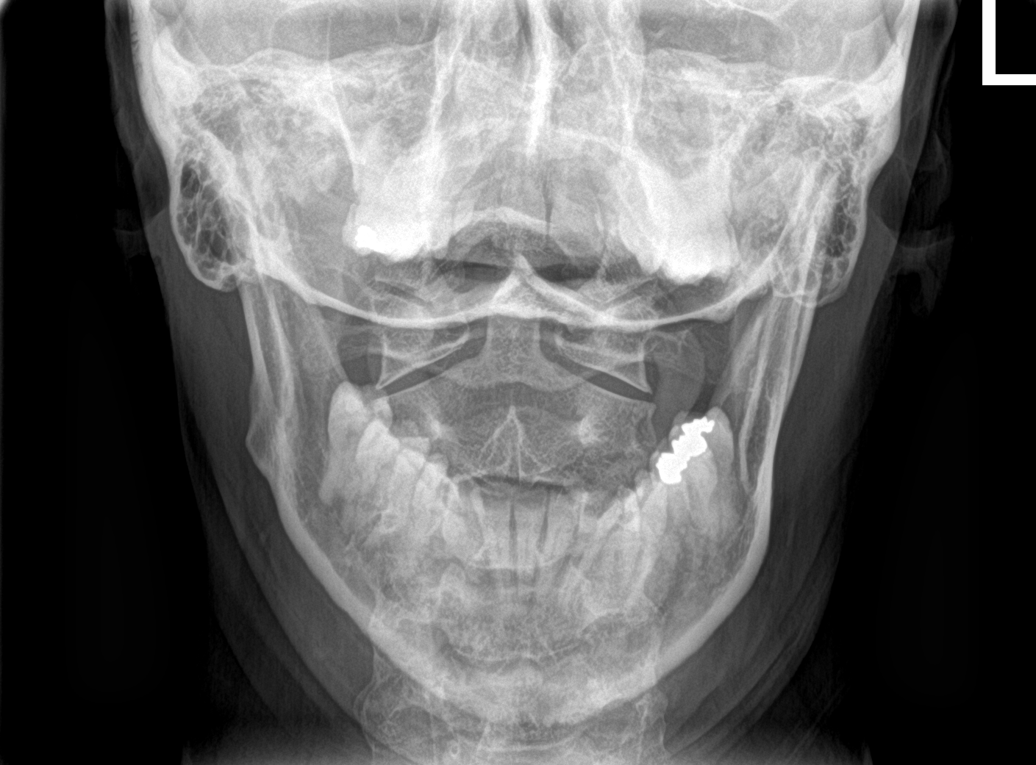

[5 of 5 positions shown; findings below may reference images not displayed]

FINDINGS: The cervical vertebral bodies are normally aligned. Disc spaces and
vertebral bodies are maintained. No significant degenerative
changes. No acute bony findings or abnormal prevertebral soft tissue
swelling. The facets are normally aligned. The neural foramen are
patent. The C1-2 articulations are maintained. The lung apices are
clear.
IMPRESSION: Normal alignment and no acute bony findings or significant
degenerative changes.

## 2020-05-10 ENCOUNTER — Other Ambulatory Visit: Payer: Self-pay | Admitting: Sports Medicine

## 2020-05-10 DIAGNOSIS — J4530 Mild persistent asthma, uncomplicated: Secondary | ICD-10-CM

## 2020-07-14 ENCOUNTER — Other Ambulatory Visit: Payer: Self-pay | Admitting: *Deleted

## 2020-07-14 DIAGNOSIS — J4530 Mild persistent asthma, uncomplicated: Secondary | ICD-10-CM

## 2020-07-14 MED ORDER — BUDESONIDE-FORMOTEROL FUMARATE 80-4.5 MCG/ACT IN AERO: 1.0000 | INHALATION_SPRAY | Freq: Two times a day (BID) | RESPIRATORY_TRACT | 3 refills | Status: DC

## 2020-11-16 ENCOUNTER — Other Ambulatory Visit: Payer: Self-pay

## 2020-11-16 ENCOUNTER — Other Ambulatory Visit: Payer: Self-pay | Admitting: Sports Medicine

## 2020-11-16 DIAGNOSIS — J4530 Mild persistent asthma, uncomplicated: Secondary | ICD-10-CM

## 2020-11-16 MED ORDER — BUDESONIDE-FORMOTEROL FUMARATE 80-4.5 MCG/ACT IN AERO: 1.0000 | INHALATION_SPRAY | Freq: Two times a day (BID) | RESPIRATORY_TRACT | 0 refills | Status: DC

## 2020-11-16 NOTE — Telephone Encounter (Signed)
Patient called to request a refill on her Symbicort. She has not been seen since 09/2019 but her husband is currently COVID positive and she has asthma. She does not want to be without her medication right now.   One refill pended for your approval.

## 2021-05-15 ENCOUNTER — Other Ambulatory Visit: Payer: Self-pay | Admitting: Sports Medicine

## 2021-05-15 DIAGNOSIS — J4530 Mild persistent asthma, uncomplicated: Secondary | ICD-10-CM

## 2022-06-26 DIAGNOSIS — Z6834 Body mass index (BMI) 34.0-34.9, adult: Secondary | ICD-10-CM | POA: Diagnosis not present

## 2022-06-26 DIAGNOSIS — Z124 Encounter for screening for malignant neoplasm of cervix: Secondary | ICD-10-CM | POA: Diagnosis not present

## 2022-06-26 DIAGNOSIS — Z1231 Encounter for screening mammogram for malignant neoplasm of breast: Secondary | ICD-10-CM | POA: Diagnosis not present

## 2022-06-26 DIAGNOSIS — Z01419 Encounter for gynecological examination (general) (routine) without abnormal findings: Secondary | ICD-10-CM | POA: Diagnosis not present

## 2022-06-26 DIAGNOSIS — Z1151 Encounter for screening for human papillomavirus (HPV): Secondary | ICD-10-CM | POA: Diagnosis not present

## 2022-06-26 LAB — HM MAMMOGRAPHY

## 2022-07-11 ENCOUNTER — Encounter: Payer: Self-pay | Admitting: Nurse Practitioner

## 2022-07-11 ENCOUNTER — Ambulatory Visit (INDEPENDENT_AMBULATORY_CARE_PROVIDER_SITE_OTHER): Payer: BC Managed Care – PPO | Admitting: Nurse Practitioner

## 2022-07-11 VITALS — BP 115/70 | HR 78 | Ht 62.0 in | Wt 193.1 lb

## 2022-07-11 DIAGNOSIS — D2370 Other benign neoplasm of skin of unspecified lower limb, including hip: Secondary | ICD-10-CM

## 2022-07-11 DIAGNOSIS — J4530 Mild persistent asthma, uncomplicated: Secondary | ICD-10-CM

## 2022-07-11 DIAGNOSIS — B001 Herpesviral vesicular dermatitis: Secondary | ICD-10-CM | POA: Diagnosis not present

## 2022-07-11 DIAGNOSIS — Z7689 Persons encountering health services in other specified circumstances: Secondary | ICD-10-CM | POA: Diagnosis not present

## 2022-07-11 MED ORDER — VALACYCLOVIR HCL 500 MG PO TABS: 500.0000 mg | ORAL_TABLET | Freq: Two times a day (BID) | ORAL | 3 refills | Status: AC

## 2022-07-11 MED ORDER — ALBUTEROL SULFATE HFA 108 (90 BASE) MCG/ACT IN AERS: 1.0000 | INHALATION_SPRAY | Freq: Four times a day (QID) | RESPIRATORY_TRACT | 11 refills | Status: DC | PRN

## 2022-07-11 MED ORDER — BUDESONIDE-FORMOTEROL FUMARATE 80-4.5 MCG/ACT IN AERO: 1.0000 | INHALATION_SPRAY | Freq: Two times a day (BID) | RESPIRATORY_TRACT | 5 refills | Status: DC

## 2022-07-11 NOTE — Progress Notes (Signed)
New Patient Office Visit  Subjective    Patient ID: [name], female    DOB: [birth year]  Age: 46 y.o. MRN: [id number]  CC:  Chief Complaint  Patient presents with   New Patient (Initial Visit)    HPI [name] presents to establish care Transferring care from Primary Care in [place].  -history of asthma.  -does need new prescriptions for her inhalers. Does need them sent in . -does have two skin lesions on lower legs she would like to have looked at    Outpatient Encounter Medications as of 07/11/2022  Medication Sig   acetaminophen (TYLENOL) 325 MG tablet Take 2 tablets (650 mg total) by mouth every 6 (six) hours as needed for mild pain (or Fever >/= 101).   fexofenadine (ALLEGRA) 180 MG tablet Take 1 tablet (180 mg total) by mouth daily.   ibuprofen (ADVIL) 200 MG tablet Take 800 mg by mouth every 6 (six) hours as needed for moderate pain.   montelukast (SINGULAIR) 10 MG tablet TAKE 1 TABLET BY MOUTH EVERYDAY AT BEDTIME   phentermine (ADIPEX-P) 37.5 MG tablet Take 1 tablet (37.5 mg total) by mouth daily before breakfast.   valACYclovir (VALTREX) 500 MG tablet Take 1 tablet (500 mg total) by mouth 2 (two) times daily.   [DISCONTINUED] budesonide-formoterol (SYMBICORT) 80-4.5 MCG/ACT inhaler Inhale 1 puff into the lungs 2 (two) times daily.   albuterol (VENTOLIN HFA) 108 (90 Base) MCG/ACT inhaler Inhale 1-2 puffs into the lungs every 6 (six) hours as needed for wheezing or shortness of breath.   budesonide-formoterol (SYMBICORT) 80-4.5 MCG/ACT inhaler Inhale 1 puff into the lungs 2 (two) times daily.   [DISCONTINUED] albuterol (PROVENTIL) (2.5 MG/3ML) 0.083% nebulizer solution Take 3 mLs (2.5 mg total) by nebulization every 6 (six) hours as needed for wheezing or shortness of breath. (Patient not taking: Reported on 07/11/2022)   [DISCONTINUED] albuterol (VENTOLIN HFA) 108 (90 Base) MCG/ACT inhaler Inhale 1-2 puffs into the lungs every 6 (six) hours as needed for  wheezing or shortness of breath. (Patient not taking: Reported on 07/11/2022)   No facility-administered encounter medications on file as of 07/11/2022.    Past Medical History:  Diagnosis Date   Asthma    rare inhaler use   Headache(784.0)    migraines   IBS (irritable bowel syndrome)    Miscarriage 04/2013   Pregnancy induced hypertension    Hx with both previous pregnancies, resolved after delivery   SVD (spontaneous vaginal delivery)    x 2   Uterus didelphus     Past Surgical History:  Procedure Laterality Date   CESAREAN SECTION  08/31/2011   Procedure: CESAREAN SECTION;  Surgeon: Margarette Asal;  Location: [facility];  Service: Gynecology;  Laterality: N/A;  primary   DILATION AND CURETTAGE OF UTERUS     x 2   DILATION AND EVACUATION N/A 03/31/2015   Procedure: DILATATION AND EVACUATION WITH ULTRASOUND GUIDANCE;  Surgeon: Molli Posey, MD;  Location: [facility];  Service: Gynecology;  Laterality: N/A;   HEMORRHOID SURGERY  2008   WISDOM TOOTH EXTRACTION      Family History  Problem Relation Age of Onset   Hypertension Mother    Thyroid disease Mother    Cancer Mother        skin   Hashimoto's thyroiditis Mother    Migraines Mother    Cancer Maternal Grandmother        colon   Thyroid disease Maternal Grandmother    Cancer Maternal  Grandfather        leaukemia   Anesthesia problems Cousin        difficulty waking up from anesthesia as child   Thyroid disease Cousin    Thyroid disease Maternal Aunt    Bone cancer Paternal Grandmother    Hypotension Neg Hx    Malignant hyperthermia Neg Hx    Pseudochol deficiency Neg Hx     Social History   Socioeconomic History   Marital status: Married    Spouse name: Juanito Doom"   Number of children: 3   Years of education: College   Highest education level: Not on file  Occupational History    Employer: OTHER    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   Caffeine Use 1 cup daily   Social Determinants of Health   Financial Resource Strain: Not on file  Food Insecurity: Not on file  Transportation Needs: Not on file  Physical Activity: Not on file  Stress: Not on file  Social Connections: Not on file  Intimate Partner Violence: Not on file    Review of Systems  Constitutional:  Negative for chills, fever and malaise/fatigue.  HENT:  Negative for congestion, sinus pain and sore throat.   Eyes: Negative.   Respiratory:  Positive for wheezing. Negative for cough and shortness of breath.        Wheezing, intermittent due to asthma   Cardiovascular:  Negative for chest pain, palpitations and leg swelling.  Gastrointestinal:  Negative for constipation, diarrhea, nausea and vomiting.  Genitourinary: Negative.   Musculoskeletal:  Negative for myalgias.  Skin:        Two small skin lesions on lower legs which she has recently noted. Fever blisters   Neurological:  Negative for dizziness and headaches.  Endo/Heme/Allergies:  Does not bruise/bleed easily.  Psychiatric/Behavioral:  Negative for depression. The patient is not nervous/anxious.         Objective    Today's Vitals   07/11/22 1454  BP: 115/70  Pulse: 78  SpO2: 98%  Weight: 193 lb 1.9 oz (87.6 kg)  Height: '5\' 2"'$  (1.575 m)   Body mass index is 35.32 kg/m.   Physical Exam Vitals and nursing note reviewed.  Constitutional:      Appearance: Normal appearance. She is well-developed.  HENT:     Head: Normocephalic and atraumatic.  Eyes:     Pupils: Pupils are equal, round, and reactive to light.  Cardiovascular:     Rate and Rhythm: Normal rate  and regular rhythm.     Pulses: Normal pulses.     Heart sounds: Normal heart sounds.  Pulmonary:     Effort: Pulmonary effort is normal.     Breath sounds: Normal breath sounds.  Abdominal:     Palpations: Abdomen is soft.  Musculoskeletal:        General: Normal range of motion.     Cervical back: Normal range of motion and neck supple.  Lymphadenopathy:     Cervical: No cervical adenopathy.  Skin:  General: Skin is warm and dry.     Capillary Refill: Capillary refill takes less than 2 seconds.     Comments: Small skin lesion on medial aspect of lower right leg. Round, approximately 3 mm in diameter. Pink/light brown in color. Non tender.  Second small 3 mm lesion on left lower leg. Round, pinto light brown in color. Non tender.   Neurological:     General: No focal deficit present.     Mental Status: She is alert and oriented to person, place, and time.  Psychiatric:        Mood and Affect: Mood normal.        Behavior: Behavior normal.        Thought Content: Thought content normal.        Judgment: Judgment normal.      Assessment & Plan:  1. Mild persistent extrinsic asthma without complication Continue Symbicort 80/4.5 mcg/ACT inhaler twice daily.  Use rescue inhaler as needed as prescribed. - budesonide-formoterol (SYMBICORT) 80-4.5 MCG/ACT inhaler; Inhale 1 puff into the lungs 2 (two) times daily.  Dispense: 1 each; Refill: 5 - albuterol (VENTOLIN HFA) 108 (90 Base) MCG/ACT inhaler; Inhale 1-2 puffs into the lungs every 6 (six) hours as needed for wheezing or shortness of breath.  Dispense: 18 g; Refill: 11  2. Fever blister Valacyclovir 500 mg to be taken 3 times daily for next 7 days.  Repeat as needed. - valACYclovir (VALTREX) 500 MG tablet; Take 1 tablet (500 mg total) by mouth 2 (two) times daily.  Dispense: 14 tablet; Refill: 3  3. Benign neoplasm of skin of lower extremity, unspecified laterality 2 very small skin lesions, 1 on each lower extremity.  We will  continue to monitor.  4. Encounter to establish care Appointment today to establish new primary care provider      Problem List Items Addressed This Visit       Respiratory   Allergic asthma - Primary   Relevant Medications   budesonide-formoterol (SYMBICORT) 80-4.5 MCG/ACT inhaler   albuterol (VENTOLIN HFA) 108 (90 Base) MCG/ACT inhaler     Digestive   Fever blister   Relevant Medications   valACYclovir (VALTREX) 500 MG tablet     Musculoskeletal and Integument   Benign neoplasm of skin of lower extremity   Other Visit Diagnoses     Encounter to establish care           Return in about 3 months (around 10/10/2022) for health maintenance exam, FBW a week prior to visit.   Ronnell Freshwater, NP

## 2022-07-23 DIAGNOSIS — D2370 Other benign neoplasm of skin of unspecified lower limb, including hip: Secondary | ICD-10-CM | POA: Insufficient documentation

## 2022-07-23 DIAGNOSIS — B001 Herpesviral vesicular dermatitis: Secondary | ICD-10-CM | POA: Insufficient documentation

## 2022-10-03 ENCOUNTER — Encounter: Payer: Self-pay | Admitting: Nurse Practitioner

## 2022-10-03 ENCOUNTER — Ambulatory Visit (INDEPENDENT_AMBULATORY_CARE_PROVIDER_SITE_OTHER): Payer: BC Managed Care – PPO | Admitting: Nurse Practitioner

## 2022-10-03 VITALS — BP 115/95 | HR 78 | Resp 18 | Ht 62.0 in | Wt 197.0 lb

## 2022-10-03 DIAGNOSIS — J4541 Moderate persistent asthma with (acute) exacerbation: Secondary | ICD-10-CM

## 2022-10-03 DIAGNOSIS — Z1329 Encounter for screening for other suspected endocrine disorder: Secondary | ICD-10-CM | POA: Diagnosis not present

## 2022-10-03 DIAGNOSIS — R052 Subacute cough: Secondary | ICD-10-CM | POA: Diagnosis not present

## 2022-10-03 DIAGNOSIS — J4530 Mild persistent asthma, uncomplicated: Secondary | ICD-10-CM

## 2022-10-03 DIAGNOSIS — Z1321 Encounter for screening for nutritional disorder: Secondary | ICD-10-CM | POA: Diagnosis not present

## 2022-10-03 DIAGNOSIS — Z130 Encounter for screening for diseases of the blood and blood-forming organs and certain disorders involving the immune mechanism: Secondary | ICD-10-CM

## 2022-10-03 DIAGNOSIS — Z0000 Encounter for general adult medical examination without abnormal findings: Secondary | ICD-10-CM | POA: Diagnosis not present

## 2022-10-03 DIAGNOSIS — Z0001 Encounter for general adult medical examination with abnormal findings: Secondary | ICD-10-CM | POA: Diagnosis not present

## 2022-10-03 DIAGNOSIS — Z13228 Encounter for screening for other metabolic disorders: Secondary | ICD-10-CM

## 2022-10-03 MED ORDER — BENZONATATE 200 MG PO CAPS: 200.0000 mg | ORAL_CAPSULE | Freq: Two times a day (BID) | ORAL | 0 refills | Status: AC | PRN

## 2022-10-03 MED ORDER — BUDESONIDE-FORMOTEROL FUMARATE 80-4.5 MCG/ACT IN AERO: 1.0000 | INHALATION_SPRAY | Freq: Two times a day (BID) | RESPIRATORY_TRACT | 5 refills | Status: DC

## 2022-10-03 MED ORDER — ALBUTEROL SULFATE HFA 108 (90 BASE) MCG/ACT IN AERS: 1.0000 | INHALATION_SPRAY | Freq: Four times a day (QID) | RESPIRATORY_TRACT | 11 refills | Status: AC | PRN

## 2022-10-03 MED ORDER — METHYLPREDNISOLONE 4 MG PO TBPK: ORAL_TABLET | ORAL | 0 refills | Status: DC

## 2022-10-03 MED ORDER — IPRATROPIUM-ALBUTEROL 0.5-2.5 (3) MG/3ML IN SOLN: 3.0000 mL | Freq: Four times a day (QID) | RESPIRATORY_TRACT | 3 refills | Status: AC | PRN

## 2022-10-03 MED ORDER — MONTELUKAST SODIUM 10 MG PO TABS: ORAL_TABLET | ORAL | 3 refills | Status: DC

## 2022-10-03 NOTE — Progress Notes (Signed)
Complete physical exam   Patient: [name]   DOB: 1976-08-23   46 y.o. Female  MRN: [id number] Visit Date: 10/03/2022    Chief Complaint  Patient presents with   Health Maintenance   Subjective    [name] is a 46 y.o. female who presents today for a complete physical exam.  She reports consuming a  generally healthy  diet.  She generally feels fairly well. She does have additional problems to discuss today.   HPI  Annual physical  -asthma flare  -having to use nebulizer treatments and rescue inhaler.  -rescue treatments helpful  -gradually improving  -breathing is better and however, she feels very tight in throat.      Past Medical History:  Diagnosis Date   Asthma    rare inhaler use   Headache(784.0)    migraines   IBS (irritable bowel syndrome)    Miscarriage 04/2013   Pregnancy induced hypertension    Hx with both previous pregnancies, resolved after delivery   SVD (spontaneous vaginal delivery)    x 2   Uterus didelphus    Past Surgical History:  Procedure Laterality Date   CESAREAN SECTION  08/31/2011   Procedure: CESAREAN SECTION;  Surgeon: Margarette Asal;  Location: [facility];  Service: Gynecology;  Laterality: N/A;  primary   DILATION AND CURETTAGE OF UTERUS     x 2   DILATION AND EVACUATION N/A 03/31/2015   Procedure: DILATATION AND EVACUATION WITH ULTRASOUND GUIDANCE;  Surgeon: Molli Posey, MD;  Location: [facility];  Service: Gynecology;  Laterality: N/A;   HEMORRHOID SURGERY  2008   WISDOM TOOTH EXTRACTION     Social History   Socioeconomic History   Marital status: Married    Spouse name: Juanito Doom"   Number of children: 3   Years of education: College   Highest education level: Not on file  Occupational History    Employer: OTHER    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   Caffeine Use 1 cup daily   Social Determinants of Health   Financial Resource Strain: Not on file  Food Insecurity: Not on file  Transportation Needs: Not on file  Physical Activity: Not on file  Stress: Not on file  Social Connections: Not on file  Intimate Partner Violence: Not on file   Family Status  Relation Name Status   Mother  Alive   Father  Alive   MGM  (Not Specified)   MGF  (Not Specified)   Cousin  (Not Specified)   Cousin  (Not Specified)   Mat Aunt  (Not Specified)   PGM  (Not Specified)   Neg Hx  (Not Specified)   Family History  Problem Relation Age of Onset   Hypertension Mother    Thyroid disease Mother    Cancer Mother        skin   Hashimoto's thyroiditis Mother    Migraines Mother    Cancer Maternal Grandmother  colon   Thyroid disease Maternal Grandmother    Cancer Maternal Grandfather        leaukemia   Anesthesia problems Cousin        difficulty waking up from anesthesia as child   Thyroid disease Cousin    Thyroid disease Maternal Aunt    Bone cancer Paternal Grandmother    Hypotension Neg Hx    Malignant hyperthermia Neg Hx    Pseudochol deficiency Neg Hx    Allergies  Allergen Reactions   Sulfa Antibiotics Rash    Patient Care Team: Ronnell Freshwater, NP as PCP - General (Family Medicine)   Medications: Outpatient Medications Prior to Visit  Medication Sig   fexofenadine (ALLEGRA) 180 MG tablet Take 1 tablet (180 mg total) by mouth daily.   HAILEY 24 FE 1-20 MG-MCG(24) tablet Take 1 tablet by mouth daily.   ibuprofen (ADVIL) 200 MG tablet Take 800 mg by mouth every 6 (six) hours as needed for moderate pain.   valACYclovir (VALTREX) 500 MG tablet Take 1 tablet (500  mg total) by mouth 2 (two) times daily.   [DISCONTINUED] albuterol (VENTOLIN HFA) 108 (90 Base) MCG/ACT inhaler Inhale 1-2 puffs into the lungs every 6 (six) hours as needed for wheezing or shortness of breath.   [DISCONTINUED] budesonide-formoterol (SYMBICORT) 80-4.5 MCG/ACT inhaler Inhale 1 puff into the lungs 2 (two) times daily.   [DISCONTINUED] montelukast (SINGULAIR) 10 MG tablet TAKE 1 TABLET BY MOUTH EVERYDAY AT BEDTIME   acetaminophen (TYLENOL) 325 MG tablet Take 2 tablets (650 mg total) by mouth every 6 (six) hours as needed for mild pain (or Fever >/= 101). (Patient not taking: Reported on 10/03/2022)   phentermine (ADIPEX-P) 37.5 MG tablet Take 1 tablet (37.5 mg total) by mouth daily before breakfast. (Patient not taking: Reported on 10/03/2022)   No facility-administered medications prior to visit.    Review of Systems  Constitutional:  Positive for fatigue. Negative for activity change, appetite change, chills and fever.  HENT:  Positive for congestion. Negative for postnasal drip, rhinorrhea, sinus pressure, sinus pain, sneezing and sore throat.   Eyes: Negative.   Respiratory:  Positive for cough, shortness of breath and wheezing. Negative for chest tightness.   Cardiovascular:  Negative for chest pain and palpitations.  Gastrointestinal:  Negative for abdominal pain, constipation, diarrhea, nausea and vomiting.  Endocrine: Negative for cold intolerance, heat intolerance, polydipsia and polyuria.  Genitourinary:  Negative for dyspareunia, dysuria, flank pain, frequency and urgency.  Musculoskeletal:  Negative for arthralgias, back pain and myalgias.  Skin:  Negative for rash.  Allergic/Immunologic: Positive for environmental allergies.  Neurological:  Positive for headaches. Negative for dizziness and weakness.  Hematological:  Negative for adenopathy.  Psychiatric/Behavioral:  The patient is not nervous/anxious.        Objective     Today's Vitals   10/03/22 0956  BP:  (Abnormal) 115/95  Pulse: 78  Resp: 18  SpO2: 100%  Weight: 197 lb (89.4 kg)  Height: 5' 2" (1.575 m)   Body mass index is 36.03 kg/m.  BP Readings from Last 3 Encounters:  10/03/22 (Abnormal) 115/95  07/11/22 115/70  10/08/19 136/80    Wt Readings from Last 3 Encounters:  10/03/22 197 lb (89.4 kg)  07/11/22 193 lb 1.9 oz (87.6 kg)  10/08/19 164 lb (74.4 kg)     Physical Exam Vitals and nursing note reviewed.  Constitutional:      Appearance: Normal appearance. She is well-developed.  HENT:     Head: Normocephalic  and atraumatic.     Right Ear: Tympanic membrane, ear canal and external ear normal.     Left Ear: Tympanic membrane, ear canal and external ear normal.     Nose: Nose normal.     Mouth/Throat:     Mouth: Mucous membranes are moist.     Pharynx: Oropharynx is clear.  Eyes:     Extraocular Movements: Extraocular movements intact.     Conjunctiva/sclera: Conjunctivae normal.     Pupils: Pupils are equal, round, and reactive to light.  Cardiovascular:     Rate and Rhythm: Normal rate and regular rhythm.     Pulses: Normal pulses.     Heart sounds: Normal heart sounds.  Pulmonary:     Effort: Pulmonary effort is normal.     Breath sounds: Normal breath sounds.  Abdominal:     General: Bowel sounds are normal. There is no distension.     Palpations: Abdomen is soft. There is no mass.     Tenderness: There is no abdominal tenderness. There is no right CVA tenderness, left CVA tenderness, guarding or rebound.     Hernia: No hernia is present.  Musculoskeletal:        General: Normal range of motion.     Cervical back: Normal range of motion and neck supple.  Skin:    General: Skin is warm and dry.     Capillary Refill: Capillary refill takes less than 2 seconds.  Neurological:     General: No focal deficit present.     Mental Status: She is alert and oriented to person, place, and time.  Psychiatric:        Mood and Affect: Mood normal.        Behavior:  Behavior normal.        Thought Content: Thought content normal.        Judgment: Judgment normal.    Last depression screening scores   Row Labels 10/03/2022   10:09 AM 07/11/2022    3:01 PM 01/30/2018    9:47 AM  PHQ 2/9 Scores   Section Header. No data exists in this row.     PHQ - 2 Score   0 0 1  PHQ- 9 Score   0 0 2   Last fall risk screening   Row Labels 10/03/2022   10:09 AM  Fall Risk    Section Header. No data exists in this row.   Falls in the past year?   0  Number falls in past yr:   0  Injury with Fall?   0    Results for orders placed or performed in visit on 10/03/22  CBC  Result Value Ref Range   WBC 6.9 3.4 - 10.8 x10E3/uL   RBC 4.41 3.77 - 5.28 x10E6/uL   Hemoglobin 14.5 11.1 - 15.9 g/dL   Hematocrit 43.5 34.0 - 46.6 %   MCV 99 (H) 79 - 97 fL   MCH 32.9 26.6 - 33.0 pg   MCHC 33.3 31.5 - 35.7 g/dL   RDW 11.5 (L) 11.7 - 15.4 %   Platelets 256 150 - 450 x10E3/uL  Comp Met (CMET)  Result Value Ref Range   Glucose 93 70 - 99 mg/dL   BUN 10 6 - 24 mg/dL   Creatinine, Ser 0.80 0.57 - 1.00 mg/dL   eGFR 93 >59 mL/min/1.73   BUN/Creatinine Ratio 13 9 - 23   Sodium 139 134 - 144 mmol/L   Potassium 4.9 3.5 - 5.2 mmol/L  Chloride 100 96 - 106 mmol/L   CO2 22 20 - 29 mmol/L   Calcium 9.4 8.7 - 10.2 mg/dL   Total Protein 7.2 6.0 - 8.5 g/dL   Albumin 4.5 3.9 - 4.9 g/dL   Globulin, Total 2.7 1.5 - 4.5 g/dL   Albumin/Globulin Ratio 1.7 1.2 - 2.2   Bilirubin Total 0.6 0.0 - 1.2 mg/dL   Alkaline Phosphatase 46 44 - 121 IU/L   AST 16 0 - 40 IU/L   ALT 16 0 - 32 IU/L  Hemoglobin A1c  Result Value Ref Range   Hgb A1c MFr Bld 5.1 4.8 - 5.6 %   Est. average glucose Bld gHb Est-mCnc 100 mg/dL  Lipid panel  Result Value Ref Range   Cholesterol, Total 157 100 - 199 mg/dL   Triglycerides 125 0 - 149 mg/dL   HDL 47 >39 mg/dL   VLDL Cholesterol Cal 22 5 - 40 mg/dL   LDL Chol Calc (NIH) 88 0 - 99 mg/dL   Chol/HDL Ratio 3.3 0.0 - 4.4 ratio  TSH  Result Value Ref  Range   TSH 2.560 0.450 - 4.500 uIU/mL    Assessment & Plan    1. Encounter for general adult medical examination with abnormal findings Annual physical today   2. Moderate persistent asthma with acute exacerbation Continue symbicort twice daily. Use rescue inhalers and/or nebulizer treatments as needed and as prescribed. Take singulair and OTC antihistamine such as claritin or zyrtec daily.  - ipratropium-albuterol (DUONEB) 0.5-2.5 (3) MG/3ML SOLN; Take 3 mLs by nebulization every 6 (six) hours as needed.  Dispense: 150 mL; Refill: 3 - albuterol (VENTOLIN HFA) 108 (90 Base) MCG/ACT inhaler; Inhale 1-2 puffs into the lungs every 6 (six) hours as needed for wheezing or shortness of breath.  Dispense: 18 g; Refill: 11 - budesonide-formoterol (SYMBICORT) 80-4.5 MCG/ACT inhaler; Inhale 1 puff into the lungs 2 (two) times daily.  Dispense: 1 each; Refill: 5 - montelukast (SINGULAIR) 10 MG tablet; TAKE 1 TABLET BY MOUTH EVERYDAY AT BEDTIME  Dispense: 90 tablet; Refill: 3  3. Subacute cough May take tessalon perls three times daily as needed for cough.  - benzonatate (TESSALON) 200 MG capsule; Take 1 capsule (200 mg total) by mouth 2 (two) times daily as needed for cough.  Dispense: 20 capsule; Refill: 0  4. Screening for endocrine, nutritional, metabolic and immunity disorder Routine, fasting labs drawn during today's visit.  - TSH; Future - Lipid panel; Future - Hemoglobin A1c; Future - Comp Met (CMET); Future - CBC; Future - CBC - Comp Met (CMET) - Hemoglobin A1c - Lipid panel - TSH  5. Health care maintenance Routine, fasting labs drawn during today's visit.  - TSH; Future - Lipid panel; Future - Hemoglobin A1c; Future - Comp Met (CMET); Future - CBC; Future - CBC - Comp Met (CMET) - Hemoglobin A1c - Lipid panel - TSH    Immunization History  Administered Date(s) Administered   Rho (D) Immune Globulin 09/01/2011   Tdap 08/14/2017    Health Maintenance  Topic Date Due    COVID-19 Vaccine (1) Never done   Hepatitis C Screening  Never done   COLONOSCOPY (Pts 45-29yr Insurance coverage will need to be confirmed)  Never done   PAP SMEAR-Modifier  06/27/2025   DTaP/Tdap/Td (2 - Td or Tdap) 08/15/2027   HIV Screening  Completed   HPV VACCINES  Aged Out   INFLUENZA VACCINE  Discontinued    Discussed health benefits of physical activity, and encouraged her  to engage in regular exercise appropriate for her age and condition.  Problem List Items Addressed This Visit       Respiratory   Allergic asthma   Relevant Medications   ipratropium-albuterol (DUONEB) 0.5-2.5 (3) MG/3ML SOLN   albuterol (VENTOLIN HFA) 108 (90 Base) MCG/ACT inhaler   budesonide-formoterol (SYMBICORT) 80-4.5 MCG/ACT inhaler   montelukast (SINGULAIR) 10 MG tablet     Other   Subacute cough   Relevant Medications   benzonatate (TESSALON) 200 MG capsule   Other Visit Diagnoses     Encounter for general adult medical examination with abnormal findings    -  Primary   Screening for endocrine, nutritional, metabolic and immunity disorder       Relevant Orders   TSH (Completed)   Lipid panel (Completed)   Hemoglobin A1c (Completed)   Comp Met (CMET) (Completed)   CBC (Completed)   Health care maintenance       Relevant Orders   TSH (Completed)   Lipid panel (Completed)   Hemoglobin A1c (Completed)   Comp Met (CMET) (Completed)   CBC (Completed)        Return in about 6 months (around 04/04/2023) for asthma.        Ronnell Freshwater, NP  [facility] at [facility] [phone] (phone) [phone] (fax)  [place]

## 2022-10-04 ENCOUNTER — Encounter: Payer: Self-pay | Admitting: Nurse Practitioner

## 2022-10-04 LAB — CBC
Hematocrit: 43.5 % (ref 34.0–46.6)
Hemoglobin: 14.5 g/dL (ref 11.1–15.9)
MCH: 32.9 pg (ref 26.6–33.0)
MCHC: 33.3 g/dL (ref 31.5–35.7)
MCV: 99 fL — ABNORMAL HIGH (ref 79–97)
Platelets: 256 10*3/uL (ref 150–450)
RBC: 4.41 x10E6/uL (ref 3.77–5.28)
RDW: 11.5 % — ABNORMAL LOW (ref 11.7–15.4)
WBC: 6.9 10*3/uL (ref 3.4–10.8)

## 2022-10-04 LAB — COMPREHENSIVE METABOLIC PANEL
ALT: 16 IU/L (ref 0–32)
AST: 16 IU/L (ref 0–40)
Albumin/Globulin Ratio: 1.7 (ref 1.2–2.2)
Albumin: 4.5 g/dL (ref 3.9–4.9)
Alkaline Phosphatase: 46 IU/L (ref 44–121)
BUN/Creatinine Ratio: 13 (ref 9–23)
BUN: 10 mg/dL (ref 6–24)
Bilirubin Total: 0.6 mg/dL (ref 0.0–1.2)
CO2: 22 mmol/L (ref 20–29)
Calcium: 9.4 mg/dL (ref 8.7–10.2)
Chloride: 100 mmol/L (ref 96–106)
Creatinine, Ser: 0.8 mg/dL (ref 0.57–1.00)
Globulin, Total: 2.7 g/dL (ref 1.5–4.5)
Glucose: 93 mg/dL (ref 70–99)
Potassium: 4.9 mmol/L (ref 3.5–5.2)
Sodium: 139 mmol/L (ref 134–144)
Total Protein: 7.2 g/dL (ref 6.0–8.5)
eGFR: 93 mL/min/{1.73_m2} (ref >59)

## 2022-10-04 LAB — LIPID PANEL
Chol/HDL Ratio: 3.3 ratio (ref 0.0–4.4)
Cholesterol, Total: 157 mg/dL (ref 100–199)
HDL: 47 mg/dL (ref >39)
LDL Chol Calc (NIH): 88 mg/dL (ref 0–99)
Triglycerides: 125 mg/dL (ref 0–149)
VLDL Cholesterol Cal: 22 mg/dL (ref 5–40)

## 2022-10-04 LAB — HEMOGLOBIN A1C
Est. average glucose Bld gHb Est-mCnc: 100 mg/dL
Hgb A1c MFr Bld: 5.1 % (ref 4.8–5.6)

## 2022-10-04 LAB — TSH: TSH: 2.56 u[IU]/mL (ref 0.450–4.500)

## 2022-10-12 ENCOUNTER — Encounter: Payer: Self-pay | Admitting: Nurse Practitioner

## 2022-10-17 ENCOUNTER — Other Ambulatory Visit: Payer: Self-pay | Admitting: Nurse Practitioner

## 2022-10-17 DIAGNOSIS — J0140 Acute pansinusitis, unspecified: Secondary | ICD-10-CM

## 2022-10-17 DIAGNOSIS — J4541 Moderate persistent asthma with (acute) exacerbation: Secondary | ICD-10-CM

## 2022-10-17 MED ORDER — METHYLPREDNISOLONE 4 MG PO TBPK: ORAL_TABLET | ORAL | 0 refills | Status: AC

## 2022-10-17 MED ORDER — AZITHROMYCIN 250 MG PO TABS: ORAL_TABLET | ORAL | 0 refills | Status: AC

## 2022-10-21 DIAGNOSIS — R052 Subacute cough: Secondary | ICD-10-CM | POA: Insufficient documentation

## 2023-03-01 ENCOUNTER — Other Ambulatory Visit: Payer: Self-pay | Admitting: Nurse Practitioner

## 2023-03-01 DIAGNOSIS — J0140 Acute pansinusitis, unspecified: Secondary | ICD-10-CM

## 2023-03-01 DIAGNOSIS — J4541 Moderate persistent asthma with (acute) exacerbation: Secondary | ICD-10-CM

## 2023-04-04 ENCOUNTER — Ambulatory Visit: Payer: BC Managed Care – PPO | Admitting: Nurse Practitioner

## 2023-07-05 ENCOUNTER — Emergency Department (HOSPITAL_COMMUNITY)
Admission: EM | Admit: 2023-07-05 | Discharge: 2023-07-05 | Disposition: A | Discharge status: Home / Self Care | Payer: BC Managed Care – PPO

## 2023-07-05 ENCOUNTER — Emergency Department (HOSPITAL_COMMUNITY): Payer: BC Managed Care – PPO

## 2023-07-05 ENCOUNTER — Other Ambulatory Visit: Payer: Self-pay

## 2023-07-05 ENCOUNTER — Encounter (HOSPITAL_COMMUNITY): Payer: Self-pay

## 2023-07-05 DIAGNOSIS — J40 Bronchitis, not specified as acute or chronic: Secondary | ICD-10-CM | POA: Insufficient documentation

## 2023-07-05 DIAGNOSIS — R079 Chest pain, unspecified: Secondary | ICD-10-CM | POA: Diagnosis present

## 2023-07-05 DIAGNOSIS — Z20822 Contact with and (suspected) exposure to covid-19: Secondary | ICD-10-CM | POA: Insufficient documentation

## 2023-07-05 DIAGNOSIS — R000 Tachycardia, unspecified: Secondary | ICD-10-CM | POA: Diagnosis not present

## 2023-07-05 LAB — BASIC METABOLIC PANEL
Anion gap: 12 (ref 5–15)
BUN: 9 mg/dL (ref 6–20)
CO2: 19 mmol/L — ABNORMAL LOW (ref 22–32)
Calcium: 8.9 mg/dL (ref 8.9–10.3)
Chloride: 106 mmol/L (ref 98–111)
Creatinine, Ser: 0.64 mg/dL (ref 0.44–1.00)
GFR, Estimated: >60 mL/min (ref >60)
Glucose, Bld: 160 mg/dL — ABNORMAL HIGH (ref 70–99)
Potassium: 3.5 mmol/L (ref 3.5–5.1)
Sodium: 137 mmol/L (ref 135–145)

## 2023-07-05 LAB — CBC
HCT: 44.6 % (ref 36.0–46.0)
Hemoglobin: 15.1 g/dL — ABNORMAL HIGH (ref 12.0–15.0)
MCH: 32.5 pg (ref 26.0–34.0)
MCHC: 33.9 g/dL (ref 30.0–36.0)
MCV: 95.9 fL (ref 80.0–100.0)
Platelets: 280 10*3/uL (ref 150–400)
RBC: 4.65 MIL/uL (ref 3.87–5.11)
RDW: 11.9 % (ref 11.5–15.5)
WBC: 8.1 10*3/uL (ref 4.0–10.5)
nRBC: 0 % (ref 0.0–0.2)

## 2023-07-05 LAB — RESP PANEL BY RT-PCR (RSV, FLU A&B, COVID)  RVPGX2
Influenza A by PCR: NEGATIVE
Influenza B by PCR: NEGATIVE
Resp Syncytial Virus by PCR: NEGATIVE
SARS Coronavirus 2 by RT PCR: NEGATIVE

## 2023-07-05 LAB — HCG, SERUM, QUALITATIVE: Preg, Serum: NEGATIVE

## 2023-07-05 LAB — TROPONIN I (HIGH SENSITIVITY): Troponin I (High Sensitivity): 2 ng/L (ref <18)

## 2023-07-05 MED ORDER — BENZONATATE 100 MG PO CAPS: 100.0000 mg | ORAL_CAPSULE | Freq: Three times a day (TID) | ORAL | 0 refills | Status: AC

## 2023-07-05 MED ORDER — IOHEXOL 350 MG/ML SOLN
75.0000 mL | Freq: Once | INTRAVENOUS | Status: AC | PRN
  Administered 2023-07-05: 75 mL via INTRAVENOUS

## 2023-07-05 MED ORDER — IPRATROPIUM-ALBUTEROL 0.5-2.5 (3) MG/3ML IN SOLN
3.0000 mL | Freq: Once | RESPIRATORY_TRACT | Status: AC
  Administered 2023-07-05: 3 mL via RESPIRATORY_TRACT
  Filled 2023-07-05: qty 3

## 2023-07-05 MED ORDER — KETOROLAC TROMETHAMINE 15 MG/ML IJ SOLN
15.0000 mg | Freq: Once | INTRAMUSCULAR | Status: AC
  Administered 2023-07-05: 15 mg via INTRAVENOUS
  Filled 2023-07-05: qty 1

## 2023-07-05 NOTE — ED Notes (Signed)
Pt taken to CT.

## 2023-07-05 NOTE — Discharge Instructions (Addendum)
Please follow-up with your primary doctor.  Return immediately if develop fevers, chills, worsening chest pain, worsening shortness of breath, labored breathing, passout, nausea vomiting or severe pain.  You may also return if develop any new or worsening symptoms that are concerning to you.

## 2023-07-05 NOTE — ED Provider Notes (Signed)
[place] EMERGENCY DEPARTMENT AT [facility] Provider Note   CSN: [id number] Arrival date & time: 07/05/23  1849     History  Chief Complaint  Patient presents with   Chest Pain    [name] is a 47 y.o. female.  47 year old female presenting emergency department for evaluation of her chest pain and shortness of breath.  Patient developed viral URI symptoms this past Saturday with scratchy sore throat then developed productiveOff.  No reported fevers.  Went to urgent care treated her for pneumonia with doxycycline, she is on day 5 of 7 of antibiotics.  Also been taking prednisone.  Went to urgent care again today who got a D-dimer and it was elevated to 750.  She denies any lower extremity edema, swelling or pain.  She reports pain is been left-sided, sharp.  However earlier it was right-sided.  Seemingly is migrating.   Chest Pain      Home Medications Prior to Admission medications   Medication Sig Start Date End Date Taking? Authorizing Provider  benzonatate (TESSALON) 100 MG capsule Take 1 capsule (100 mg total) by mouth every 8 (eight) hours for 5 days. 07/05/23 07/10/23 Yes Maha Fischel, Harmon Dun, DO  doxycycline (ADOXA) 100 MG tablet Take 100 mg by mouth every 12 (twelve) hours. 07/01/23  Yes [provider]  acetaminophen (TYLENOL) 325 MG tablet Take 2 tablets (650 mg total) by mouth every 6 (six) hours as needed for mild pain (or Fever >/= 101). Patient not taking: Reported on 10/03/2022 09/17/19   Alwyn Ren, MD  albuterol (VENTOLIN HFA) 108 (90 Base) MCG/ACT inhaler Inhale 1-2 puffs into the lungs every 6 (six) hours as needed for wheezing or shortness of breath. 10/03/22   Carlean Jews, NP  azithromycin (ZITHROMAX) 250 MG tablet z-pack - take as directed for 5 days 10/17/22   Carlean Jews, NP  benzonatate (TESSALON) 200 MG capsule Take 1 capsule (200 mg total) by mouth 2 (two) times daily as needed for cough. 10/03/22   Carlean Jews,  NP  budesonide-formoterol (SYMBICORT) 80-4.5 MCG/ACT inhaler Inhale 1 puff into the lungs 2 (two) times daily. 10/03/22   Carlean Jews, NP  fexofenadine (ALLEGRA) 180 MG tablet Take 1 tablet (180 mg total) by mouth daily. 10/14/19   Monica Becton, MD  HAILEY 24 FE 1-20 MG-MCG(24) tablet Take 1 tablet by mouth daily. 07/14/22   [provider]  ibuprofen (ADVIL) 200 MG tablet Take 800 mg by mouth every 6 (six) hours as needed for moderate pain.    [provider]  ipratropium-albuterol (DUONEB) 0.5-2.5 (3) MG/3ML SOLN Take 3 mLs by nebulization every 6 (six) hours as needed. 10/03/22   Carlean Jews, NP  methylPREDNISolone (MEDROL) 4 MG TBPK tablet Take by mouth as directed for 6 days 10/17/22   Carlean Jews, NP  montelukast (SINGULAIR) 10 MG tablet TAKE 1 TABLET BY MOUTH EVERYDAY AT BEDTIME 10/03/22   Carlean Jews, NP  Norethindrone Acetate-Ethinyl Estrad-FE (HAILEY 24 FE) 1-20 MG-MCG(24) tablet Take 1 tablet by mouth daily.    [provider]  phentermine (ADIPEX-P) 37.5 MG tablet Take 1 tablet (37.5 mg total) by mouth daily before breakfast. Patient not taking: Reported on 10/03/2022 10/08/19   Monica Becton, MD  valACYclovir (VALTREX) 500 MG tablet Take 1 tablet (500 mg total) by mouth 2 (two) times daily. 07/11/22   Carlean Jews, NP      Allergies    Sulfa antibiotics  Review of Systems   Review of Systems  Cardiovascular:  Positive for chest pain.    Physical Exam Updated Vital Signs BP (!) 155/122   Pulse (!) 115   Temp 98.5 F (36.9 C) (Oral)   Resp (!) 22   Ht 5\' 2"  (1.575 m)   Wt 90.3 kg   LMP 07/01/2023   SpO2 100%   BMI 36.40 kg/m  Physical Exam Vitals and nursing note reviewed.  Constitutional:      General: She is not in acute distress.    Appearance: She is not toxic-appearing.  Eyes:     Pupils: Pupils are equal, round, and reactive to light.  Cardiovascular:     Rate and Rhythm: Regular rhythm.  Tachycardia present.     Heart sounds: Normal heart sounds.  Pulmonary:     Effort: Pulmonary effort is normal. No accessory muscle usage or respiratory distress.  Musculoskeletal:     Right lower leg: No tenderness. No edema.     Left lower leg: No tenderness. No edema.  Skin:    General: Skin is warm and dry.  Neurological:     General: No focal deficit present.     Mental Status: She is alert.  Psychiatric:        Mood and Affect: Mood normal.        Behavior: Behavior normal.     ED Results / Procedures / Treatments   Labs (all labs ordered are listed, but only abnormal results are displayed) Labs Reviewed  BASIC METABOLIC PANEL - Abnormal; Notable for the following components:      Result Value   CO2 19 (*)    Glucose, Bld 160 (*)    All other components within normal limits  CBC - Abnormal; Notable for the following components:   Hemoglobin 15.1 (*)    All other components within normal limits  RESP PANEL BY RT-PCR (RSV, FLU A&B, COVID)  RVPGX2  HCG, SERUM, QUALITATIVE  TROPONIN I (HIGH SENSITIVITY)  TROPONIN I (HIGH SENSITIVITY)    EKG None  Radiology CT Angio Chest Pulmonary Embolism (PE) W or WO Contrast  Result Date: 07/05/2023 CLINICAL DATA:  Pulmonary embolism (PE) suspected, low to intermediate prob, positive D-dimer Chest pain.  Shortness of breath. EXAM: CT ANGIOGRAPHY CHEST WITH CONTRAST TECHNIQUE: Multidetector CT imaging of the chest was performed using the standard protocol during bolus administration of intravenous contrast. Multiplanar CT image reconstructions and MIPs were obtained to evaluate the vascular anatomy. RADIATION DOSE REDUCTION: This exam was performed according to the departmental dose-optimization program which includes automated exposure control, adjustment of the mA and/or kV according to patient size and/or use of iterative reconstruction technique. CONTRAST:  75mL OMNIPAQUE IOHEXOL 350 MG/ML SOLN COMPARISON:  Chest radiograph earlier  today. FINDINGS: Cardiovascular: There are no filling defects within the pulmonary arteries to suggest pulmonary embolus. The thoracic aorta is normal in caliber. The heart is normal in size. No pericardial effusion. Mediastinum/Nodes: Minimal hiatal hernia. No mediastinal or hilar adenopathy. No thyroid nodule. Lungs/Pleura: Mild bronchial thickening. Heterogeneous pulmonary parenchyma. No focal airspace disease. No pleural fluid. No pulmonary nodule or mass. No features of pulmonary edema. Upper Abdomen: No acute abnormality. Musculoskeletal: There are no acute or suspicious osseous abnormalities. Review of the MIP images confirms the above findings. IMPRESSION: 1. No pulmonary embolus. 2. Mild bronchial thickening with heterogeneous pulmonary parenchyma, can be seen with small airways disease. Electronically Signed   By: Narda Rutherford M.D.   On: 07/05/2023 21:24  DG Chest 2 View  Result Date: 07/05/2023 CLINICAL DATA:  Chest pain for 1 week EXAM: CHEST - 2 VIEW COMPARISON:  07/05/2023 from [facility] FINDINGS: Midline trachea. Normal heart size and mediastinal contours. No pleural effusion or pneumothorax. Clear lungs. IMPRESSION: No active cardiopulmonary disease. Electronically Signed   By: Jeronimo Greaves M.D.   On: 07/05/2023 20:15    Procedures Procedures    Medications Ordered in ED Medications  ketorolac (TORADOL) 15 MG/ML injection 15 mg (has no administration in time range)  ipratropium-albuterol (DUONEB) 0.5-2.5 (3) MG/3ML nebulizer solution 3 mL (3 mLs Nebulization Given 07/05/23 2046)  iohexol (OMNIPAQUE) 350 MG/ML injection 75 mL (75 mLs Intravenous Contrast Given 07/05/23 2018)    ED Course/ Medical Decision Making/ A&P Clinical Course as of 07/05/23 2212  Thu Jul 05, 2023  2145 CT Angio Chest Pulmonary Embolism (PE) W or WO Contrast IMPRESSION: 1. No pulmonary embolus. 2. Mild bronchial thickening with heterogeneous pulmonary parenchyma, can be seen with small airways  disease   [TY]  2145 DG Chest 2 View IMPRESSION: No active cardiopulmonary disease.   [TY]  2145 SARS Coronavirus 2 by RT PCR: NEGATIVE [TY]  2145 Influenza A By PCR: NEGATIVE [TY]  2145 CBC(!) No significant leukocytosis. [TY]  2145 Preg, Serum: NEGATIVE [TY]  2145 Basic metabolic panel(!) No significant metabolic derangements. [TY]  2146 Troponin I (High Sensitivity): 2 ACS unlikely. [TY]  2207 BUN: 9 [TY]  2207 Patient reevaluated.  Reports minor improvement after breathing treatment.  Continues to have the chest discomfort.  Suspect bronchitis versus costochondritis versus pleuritis.  Given symptoms been ongoing for the better part of today.  ACS unlikely given negative troponin and EKG.  CTA negative for PE.  She has no fever or leukocytosis to suggest systemic infection.  Heart rate has improved into the low 100s.  Will give Toradol.  Discussed further watchful waiting and outpatient follow-up.  Patient agreeable to plan. [TY]    Clinical Course User Index [TY] Coral Spikes, DO                                 Medical Decision Making Is a 47 year old female presenting emergency department for chest pain shortness of breath.  See ED course for full MDM disposition.  Amount and/or Complexity of Data Reviewed Independent Historian:     Details: Spouse notes productive cough with greenish-yellowish sputum. External Data Reviewed:     Details: Paperwork with patient appears to be on doxycycline 100 mg.  Was given Solu-Medrol today and prednisone. Labs: ordered. Decision-making details documented in ED Course.    Details: See ED course Radiology: ordered. Decision-making details documented in ED Course.    Details: See ED course.  Risk Prescription drug management.           Final Clinical Impression(s) / ED Diagnoses Final diagnoses:  Bronchitis    Rx / DC Orders ED Discharge Orders          Ordered    benzonatate (TESSALON) 100 MG capsule  Every 8 hours         07/05/23 2212              Coral Spikes, DO 07/05/23 2212

## 2023-07-05 NOTE — ED Triage Notes (Addendum)
Patient has had chest pain for 1 week. Has been feeling short of breath that has worsened today. Was diagnosed with respiratory infection. Has elevated d dimer per urgent care. Stated she is coughing so much and it hurts to breath. Mucus is yellow.

## 2023-07-17 ENCOUNTER — Encounter: Payer: Self-pay | Admitting: Family Medicine

## 2023-07-17 ENCOUNTER — Ambulatory Visit: Payer: BC Managed Care – PPO | Admitting: Family Medicine

## 2023-07-17 VITALS — BP 144/87 | HR 92 | Resp 20 | Ht 62.0 in | Wt 200.0 lb

## 2023-07-17 DIAGNOSIS — R030 Elevated blood-pressure reading, without diagnosis of hypertension: Secondary | ICD-10-CM

## 2023-07-17 DIAGNOSIS — R052 Subacute cough: Secondary | ICD-10-CM | POA: Diagnosis not present

## 2023-07-17 MED ORDER — GUAIFENESIN ER 600 MG PO TB12: 600.0000 mg | ORAL_TABLET | Freq: Two times a day (BID) | ORAL | 1 refills | Status: AC | PRN

## 2023-07-17 NOTE — Assessment & Plan Note (Signed)
Discussed how cough can persist for several weeks after a viral infection.  Has taken antibiotics although it was only doxycycline so there is the possibility it was not adequately treated, but CT scan did not indicate a bacterial pneumonia.  Initial symptoms started 23 days ago. - Guaifenesin 600 mg twice daily as needed prescribed to help break up mucus.  Patient advised to use guaifenesin along with her existing cough medicines and a combination that reduces her cough.  Advised her she may need to adjust the timing of these treatments. - Sputum culture ordered and container provided.  Patient to provided sample and return it to the clinic.  If indicates presence of bacteria can send in new prescription for antibiotics - Advised to return to clinic if symptoms not improved after 2 weeks.

## 2023-07-17 NOTE — Progress Notes (Signed)
Established Patient Office Visit  Subjective   Patient ID: [name], female    DOB: Nov 19, 1975  Age: 47 y.o. MRN: [id number]  Chief Complaint  Patient presents with   Follow-up    HPI Cough-patient recently went to urgent care and was prescribed doxycycline and steroids for an upper respiratory infection.  She then returned when symptoms worsened with fever and a D-dimer was ordered and elevated.  Was then sent to the emergency department where she had a CTA chest that ruled out pulmonary embolism.  She was given more steroids and some cough medication.  She continues to have persistent cough.  When she takes the cough medicine at night she has worse cough in the morning.  When she does not take it at night she has cough during the night that keeps her up.  Had 1 day of fever but not since taking antibiotics.  Her daughter is now sick with fever and cough.  All of her viral testing came back negative.  Patient denies swelling of the legs or pain in the legs.  Hypertension-patient states she has been diagnosed with whitecoat hypertension in the past.  She does not use a 24-hour blood pressure monitor but did take home blood pressure readings that were normal.  Usually when she has her blood pressure rechecked at the end of the visit it is better.   The 10-year ASCVD risk score (Arnett DK, et al., 2019) is: 1%  Health Maintenance Due  Topic Date Due   COVID-19 Vaccine (1) Never done   Hepatitis C Screening  Never done   Colonoscopy  Never done      Objective:     BP (!) 144/87   Pulse 92   Resp 20   Ht 5\' 2"  (1.575 m)   Wt 200 lb (90.7 kg)   LMP 07/01/2023   SpO2 98%   BMI 36.58 kg/m    Physical Exam General: Alert and oriented CV: Regular rate and rhythm Pulmonary: Patient coughing, voice is hoarse.  No wheezes or rhonchi appreciated.  No respiratory distress. MSK: Lower extremities symmetrical.  No swelling of the legs.  No tenderness to palpation of the knee,  popliteal region, calf.   No results found for any visits on 07/17/23.      Assessment & Plan:   Subacute cough Assessment & Plan: Discussed how cough can persist for several weeks after a viral infection.  Has taken antibiotics although it was only doxycycline so there is the possibility it was not adequately treated, but CT scan did not indicate a bacterial pneumonia.  Initial symptoms started 23 days ago. - Guaifenesin 600 mg twice daily as needed prescribed to help break up mucus.  Patient advised to use guaifenesin along with her existing cough medicines and a combination that reduces her cough.  Advised her she may need to adjust the timing of these treatments. - Sputum culture ordered and container provided.  Patient to provided sample and return it to the clinic.  If indicates presence of bacteria can send in new prescription for antibiotics - Advised to return to clinic if symptoms not improved after 2 weeks.  Orders: -     Expectorated Sputum Assessment w Gram Stain, Rflx to Resp Cult -     Acid Fast Smear+Culture W/Rflx  White coat syndrome with high blood pressure but without hypertension Assessment & Plan: Elevated today and on repeat.  Will need to keep an eye on this but it appears this  has been addressed in the past.  I do not see that it was done with a 24-hour blood pressure monitor so if it continues to be significantly elevated, we should discuss using 24-hour blood pressure monitoring instead of relying on her documented home readings to fully rule out essential hypertension.   Other orders -     guaiFENesin ER; Take 1 tablet (600 mg total) by mouth 2 (two) times daily as needed.  Dispense: 30 tablet; Refill: 1     Return if symptoms worsen or fail to improve.    Sandre Kitty, MD

## 2023-07-17 NOTE — Assessment & Plan Note (Signed)
Elevated today and on repeat.  Will need to keep an eye on this but it appears this has been addressed in the past.  I do not see that it was done with a 24-hour blood pressure monitor so if it continues to be significantly elevated, we should discuss using 24-hour blood pressure monitoring instead of relying on her documented home readings to fully rule out essential hypertension.

## 2023-07-17 NOTE — Patient Instructions (Signed)
It was nice to see you today,  We addressed the following topics today: -I have prescribed guaifenesin.  This is a mucolytic that will help bring up the mucus and hopefully help your cough.  You can also take the other medications that you have already been prescribed with this medicine. - We have provided you with a container.  You will use this to give your sputum sample.  After you have provided your sample please bring it back to [place] so that we can send it to [facility]. - If you continue to have prolonged or worsening cough 2 weeks from now let us know and we can do further workup.  Have a great day,  Frederic Jericho, MD

## 2023-07-24 ENCOUNTER — Other Ambulatory Visit: Payer: Self-pay | Admitting: Family Medicine

## 2023-07-24 ENCOUNTER — Encounter: Payer: Self-pay | Admitting: Family Medicine

## 2023-07-24 DIAGNOSIS — H6993 Unspecified Eustachian tube disorder, bilateral: Secondary | ICD-10-CM

## 2023-12-20 ENCOUNTER — Other Ambulatory Visit: Payer: Self-pay | Admitting: Nurse Practitioner

## 2023-12-20 DIAGNOSIS — J4541 Moderate persistent asthma with (acute) exacerbation: Secondary | ICD-10-CM

## 2023-12-20 NOTE — Telephone Encounter (Signed)
 [name]. This patient has you as PCP  -Herbert Seta

## 2024-06-18 ENCOUNTER — Other Ambulatory Visit: Payer: Self-pay | Admitting: Nurse Practitioner

## 2024-06-18 DIAGNOSIS — J4541 Moderate persistent asthma with (acute) exacerbation: Secondary | ICD-10-CM

## 2024-06-19 MED ORDER — BUDESONIDE-FORMOTEROL FUMARATE 80-4.5 MCG/ACT IN AERO: 1.0000 | INHALATION_SPRAY | Freq: Two times a day (BID) | RESPIRATORY_TRACT | 5 refills | Status: AC
